# Patient Record
Sex: Male | Born: 1990 | Race: Black or African American | Hispanic: No | Marital: Single | Smoking: Never smoker
Health system: Southern US, Community
[De-identification: ages and names within clinical notes are randomized; demographics above are authoritative.]

## PROBLEM LIST (undated history)

## (undated) DIAGNOSIS — T7840XA Allergy, unspecified, initial encounter: Secondary | ICD-10-CM

## (undated) HISTORY — PX: ADENOIDECTOMY: SUR15

## (undated) HISTORY — DX: Allergy, unspecified, initial encounter: T78.40XA

---
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
## 2010-05-07 ENCOUNTER — Emergency Department (HOSPITAL_COMMUNITY): Admission: EM | Admit: 2010-05-07 | Discharge: 2010-05-07 | Payer: Self-pay | Admitting: Emergency Medicine

## 2010-05-12 ENCOUNTER — Emergency Department (HOSPITAL_COMMUNITY): Admission: EM | Admit: 2010-05-12 | Discharge: 2010-05-12 | Payer: Self-pay | Admitting: Emergency Medicine

## 2010-10-17 ENCOUNTER — Emergency Department (HOSPITAL_COMMUNITY): Admission: EM | Admit: 2010-10-17 | Discharge: 2010-10-18 | Payer: Self-pay | Admitting: Emergency Medicine

## 2011-12-21 ENCOUNTER — Encounter (HOSPITAL_COMMUNITY): Payer: Self-pay | Admitting: *Deleted

## 2011-12-21 ENCOUNTER — Emergency Department (HOSPITAL_COMMUNITY): Payer: Self-pay

## 2011-12-21 ENCOUNTER — Emergency Department (HOSPITAL_COMMUNITY)
Admission: EM | Admit: 2011-12-21 | Discharge: 2011-12-21 | Disposition: A | Discharge status: Home / Self Care | Payer: Self-pay | Attending: Emergency Medicine | Admitting: Emergency Medicine

## 2011-12-21 DIAGNOSIS — R5381 Other malaise: Secondary | ICD-10-CM | POA: Insufficient documentation

## 2011-12-21 DIAGNOSIS — IMO0001 Reserved for inherently not codable concepts without codable children: Secondary | ICD-10-CM | POA: Insufficient documentation

## 2011-12-21 DIAGNOSIS — R05 Cough: Secondary | ICD-10-CM | POA: Insufficient documentation

## 2011-12-21 DIAGNOSIS — B9789 Other viral agents as the cause of diseases classified elsewhere: Secondary | ICD-10-CM | POA: Insufficient documentation

## 2011-12-21 DIAGNOSIS — R5383 Other fatigue: Secondary | ICD-10-CM | POA: Insufficient documentation

## 2011-12-21 DIAGNOSIS — B349 Viral infection, unspecified: Secondary | ICD-10-CM

## 2011-12-21 DIAGNOSIS — R059 Cough, unspecified: Secondary | ICD-10-CM | POA: Insufficient documentation

## 2011-12-21 DIAGNOSIS — R062 Wheezing: Secondary | ICD-10-CM | POA: Insufficient documentation

## 2011-12-21 LAB — POCT I-STAT, CHEM 8
Calcium, Ion: 1.24 mmol/L (ref 1.12–1.32)
Creatinine, Ser: 1.4 mg/dL — ABNORMAL HIGH (ref 0.50–1.35)
Glucose, Bld: 89 mg/dL (ref 70–99)
Hemoglobin: 16.7 g/dL (ref 13.0–17.0)
TCO2: 26 mmol/L (ref 0–100)

## 2011-12-21 MED ORDER — IPRATROPIUM BROMIDE 0.02 % IN SOLN
0.5000 mg | Freq: Once | RESPIRATORY_TRACT | Status: AC
  Administered 2011-12-21: 0.5 mg via RESPIRATORY_TRACT
  Filled 2011-12-21: qty 2.5

## 2011-12-21 MED ORDER — ALBUTEROL SULFATE (5 MG/ML) 0.5% IN NEBU
5.0000 mg | INHALATION_SOLUTION | Freq: Once | RESPIRATORY_TRACT | Status: AC
  Administered 2011-12-21: 5 mg via RESPIRATORY_TRACT
  Filled 2011-12-21: qty 1

## 2011-12-21 MED ORDER — OSELTAMIVIR PHOSPHATE 75 MG PO CAPS: 75.0000 mg | ORAL_CAPSULE | Freq: Two times a day (BID) | ORAL | Status: AC

## 2011-12-21 NOTE — ED Provider Notes (Signed)
History     CSN: [id number]  Arrival date & time 12/21/11  0202   First MD Initiated Contact with Patient 12/21/11 0351      Chief Complaint  Patient presents with  . Influenza    HPI: Patient is a 21 y.o. male presenting with flu symptoms. The history is provided by the patient.  Influenza This is a new problem. The current episode started yesterday. The problem occurs constantly. The problem has been gradually worsening. Associated symptoms include chills, congestion, coughing, diaphoresis, fatigue, a fever and myalgias. Pertinent negatives include no abdominal pain, nausea, rash, sore throat or vomiting. The symptoms are aggravated by nothing. He has tried NSAIDs for the symptoms. The treatment provided mild relief.  Patient reports onset of flu like symptoms yesterday morning. States it began as itchy throat. He has since had diarrhea x 2, intermittent cough and temperature time as high as 102. Patient also has a history of asthma and states that he has had to use his inhalers and nebs more frequently than usual.  Past Medical History  Diagnosis Date  . Asthma     Past Surgical History  Procedure Date  . Adenoidectomy     History reviewed. No pertinent family history.  History  Substance Use Topics  . Smoking status: Not on file  . Smokeless tobacco: Not on file  . Alcohol Use:       Review of Systems  Constitutional: Positive for fever, chills, diaphoresis and fatigue.  HENT: Positive for congestion. Negative for sore throat.   Eyes: Negative.   Respiratory: Positive for cough.   Cardiovascular: Negative.   Gastrointestinal: Negative.  Negative for nausea, vomiting and abdominal pain.  Genitourinary: Negative.   Musculoskeletal: Positive for myalgias.  Skin: Negative.  Negative for rash.  Neurological: Negative.   Hematological: Negative.   Psychiatric/Behavioral: Negative.     Allergies  Review of patient's allergies indicates no known allergies.  Home  Medications   Current Outpatient Rx  Name Route Sig Dispense Refill  . ALBUTEROL SULFATE (2.5 MG/3ML) 0.083% IN NEBU Nebulization Take 2.5 mg by nebulization every 6 (six) hours as needed.    Marland Kitchen FLUTICASONE-SALMETEROL 250-50 MCG/DOSE IN AEPB Inhalation Inhale 1 puff into the lungs every 12 (twelve) hours.      BP 131/71  Pulse 90  Temp(Src) 99.1 F (37.3 C) (Oral)  Resp 16  Ht 6\' 3"  (1.905 m)  Wt 210 lb (95.255 kg)  BMI 26.25 kg/m2  SpO2 99%  Physical Exam  Constitutional: He appears well-developed and well-nourished.  HENT:  Head: Normocephalic and atraumatic.  Eyes: Conjunctivae are normal.  Neck: Neck supple.  Cardiovascular: Normal rate and regular rhythm.   Pulmonary/Chest: Effort normal. Not tachypneic. No respiratory distress.       Mild exp wheezes bil   Abdominal: Soft. Bowel sounds are normal.  Musculoskeletal: Normal range of motion.  Neurological: He is alert.  Skin: Skin is warm and dry.  Psychiatric: He has a normal mood and affect.    ED Course  Procedures Bilateralexp  wheezing has resolved after albuterol/Atrovent neb.Findings and clinical impression discussed with patient. We will plan for discharge home with prescription for Tamiflu and encourage patient to start as soon as possible. Will provide "[facility]"number to assist this patient in getting established with a primary care physician in this area as he is a Consulting civil engineer at [employer]. Will encourage follow up with primary care physician in his hometown when he returns home. Patient to return for worsening symptoms. Patient  agreeable with plan   Labs Reviewed  RAPID STREP SCREEN   No results found.   No diagnosis found.    MDM  HPI/PE and clinical findings consistent with viral syndrome. Chest x-ray negative for pneumonia Slight elevation in serum creatinine likely due to mild dehydration. Will treat with Tamiflu        Leanne Chang, NP 12/21/11 2214

## 2011-12-21 NOTE — ED Notes (Signed)
Pt awoke yesterday with an itchy throat which progressed to throat soreness, cough, diarrhea, and fever.  Pt denies vomiting.

## 2011-12-22 NOTE — ED Provider Notes (Signed)
Medical screening examination/treatment/procedure(s) were performed by non-physician practitioner and as supervising physician I was immediately available for consultation/collaboration.  Jasmine Awe, MD 12/22/11 [phone]

## 2012-05-12 ENCOUNTER — Ambulatory Visit (INDEPENDENT_AMBULATORY_CARE_PROVIDER_SITE_OTHER): Payer: BC Managed Care – PPO | Admitting: Physician Assistant

## 2012-05-12 VITALS — BP 114/68 | HR 71 | Temp 98.7°F | Resp 18 | Ht 73.5 in | Wt 204.0 lb

## 2012-05-12 DIAGNOSIS — J309 Allergic rhinitis, unspecified: Secondary | ICD-10-CM

## 2012-05-12 DIAGNOSIS — J029 Acute pharyngitis, unspecified: Secondary | ICD-10-CM

## 2012-05-12 LAB — POCT RAPID STREP A (OFFICE): Rapid Strep A Screen: NEGATIVE

## 2012-05-12 MED ORDER — MONTELUKAST SODIUM 10 MG PO TABS: 10.0000 mg | ORAL_TABLET | Freq: Every day | ORAL | Status: DC

## 2012-05-12 MED ORDER — IPRATROPIUM BROMIDE 0.03 % NA SOLN: 2.0000 | Freq: Two times a day (BID) | NASAL | Status: DC

## 2012-05-12 MED ORDER — AMOXICILLIN 875 MG PO TABS: 875.0000 mg | ORAL_TABLET | Freq: Two times a day (BID) | ORAL | Status: AC

## 2012-05-12 MED ORDER — PREDNISONE 20 MG PO TABS: ORAL_TABLET | ORAL | Status: AC

## 2012-05-12 NOTE — Progress Notes (Signed)
  Subjective:    Patient ID: Jacob Harvey, male    DOB: 04-Apr-1991, 21 y.o.   MRN: [id number]  HPI Patient presents with 2 day history of sore throat that was worse this a.m. He also complains of allergies that have not responded to OTC treatment. He states he has used Zyrtec, Claritin, and Allegra all of which have failed.  He has used nasal spray's in the past which did not seem to help either. He has a history of asthma treated with Advair and albuterol prn.  He says it is well controlled except flares occasionally with allergies.  No cough, fever, chills, nausea, vomiting, otalgia, or sinus pain.     Review of Systems  All other systems reviewed and are negative.       Objective:   Physical Exam  Constitutional: He is oriented to person, place, and time. He appears well-developed and well-nourished.  HENT:  Head: Normocephalic and atraumatic.  Right Ear: External ear normal.  Left Ear: External ear normal.  Mouth/Throat: Uvula is midline. Oropharyngeal exudate (on right tonsil) present.       1+ bilateral swelling  Eyes: Conjunctivae are normal.  Neck: Neck supple.  Cardiovascular: Normal rate, regular rhythm and normal heart sounds.   Pulmonary/Chest: He has wheezes (diffuse expiratory wheezes).  Lymphadenopathy:    He has cervical adenopathy (AC LAD).  Neurological: He is alert and oriented to person, place, and time.  Psychiatric: He has a normal mood and affect. His behavior is normal. Judgment and thought content normal.    Patient declined breathing treatment today.       Assessment & Plan:   1. Acute pharyngitis  Amoxicillin 875 mg bid x 10 days.  Recommend OTC tylenol or ibuprofen prn pain.  POCT rapid strep A  2. Allergic rhinitis  Will start singulair today due to failure of zyrtec, allegra, and claritin. Patient also has tried flonase/nasonex. Will try Atrovent NS today to see if it helps with rhinitis.

## 2012-05-12 NOTE — Patient Instructions (Signed)

## 2012-07-03 ENCOUNTER — Ambulatory Visit (INDEPENDENT_AMBULATORY_CARE_PROVIDER_SITE_OTHER): Payer: BC Managed Care – PPO | Admitting: Physician Assistant

## 2012-07-03 VITALS — BP 124/68 | HR 78 | Temp 98.9°F | Resp 16 | Ht 73.5 in | Wt 194.0 lb

## 2012-07-03 DIAGNOSIS — Z202 Contact with and (suspected) exposure to infections with a predominantly sexual mode of transmission: Secondary | ICD-10-CM

## 2012-07-03 DIAGNOSIS — J45909 Unspecified asthma, uncomplicated: Secondary | ICD-10-CM

## 2012-07-03 DIAGNOSIS — Z113 Encounter for screening for infections with a predominantly sexual mode of transmission: Secondary | ICD-10-CM

## 2012-07-03 LAB — POCT UA - MICROSCOPIC ONLY
Crystals, Ur, HPF, POC: NEGATIVE
Epithelial cells, urine per micros: NEGATIVE
Mucus, UA: POSITIVE
Yeast, UA: NEGATIVE

## 2012-07-03 LAB — POCT URINALYSIS DIPSTICK
Blood, UA: NEGATIVE
Leukocytes, UA: NEGATIVE
Nitrite, UA: NEGATIVE
Protein, UA: NEGATIVE
pH, UA: 6

## 2012-07-03 MED ORDER — AZITHROMYCIN 250 MG PO TABS: 1000.0000 mg | ORAL_TABLET | Freq: Once | ORAL | Status: DC

## 2012-07-03 MED ORDER — ALBUTEROL SULFATE HFA 108 (90 BASE) MCG/ACT IN AERS: 2.0000 | INHALATION_SPRAY | RESPIRATORY_TRACT | Status: DC | PRN

## 2012-07-03 NOTE — Progress Notes (Signed)
Subjective:    Patient ID: Jacob Harvey, male    DOB: 02/02/1991, 21 y.o.   MRN: [id number]  HPI This 21 y.o. Male presents for STI exposure.  Current sexual partner just called to notify him that she has chlamydia.  He has no symptoms.  Last week, his partner told him she had a UTI.  Inconsistent condom use.      Review of Systems As above.   Past Medical History  Diagnosis Date  . Asthma   . Allergy     Past Surgical History  Procedure Date  . Adenoidectomy     Prior to Admission medications   Medication Sig Start Date End Date Taking? Authorizing Provider  albuterol (PROVENTIL HFA;VENTOLIN HFA) 108 (90 BASE) MCG/ACT inhaler Inhale 2 puffs into the lungs every 4 (four) hours as needed. 07/03/12  Yes Peighton Mehra S Keelen Quevedo, PA-C  Fluticasone-Salmeterol (ADVAIR) 250-50 MCG/DOSE AEPB Inhale 1 puff into the lungs every 12 (twelve) hours.   Yes Historical Provider, MD  montelukast (SINGULAIR) 10 MG tablet Take 1 tablet (10 mg total) by mouth at bedtime. 05/12/12 05/12/13 Yes Heather M Marte, PA-C  azithromycin (ZITHROMAX) 250 MG tablet Take 4 tablets (1,000 mg total) by mouth once. 07/03/12   Marquett Bertoli S Hassan Blackshire, PA-C  ipratropium (ATROVENT) 0.03 % nasal spray Place 2 sprays into the nose 2 (two) times daily. 05/12/12 05/12/13  Nelva Nay, PA-C    No Known Allergies  History   Social History  . Marital Status: Single    Spouse Name: n/a    Number of Children: 0  . Years of Education: 16   Occupational History  . forklift operator     [employer]  . 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 Smoking status: Passive Smoker    Types: Cigars  . Smokeless tobacco: Never Used  . Alcohol Use: 4.8 oz/week    6 Cans of beer, 2 Shots of liquor per week  . Drug Use: No  . Sexually Active: Yes -- Male partner(s)   History reviewed. No pertinent family history.     Objective:   Physical Exam  Blood pressure  124/68, pulse 78, temperature 98.9 F (37.2 C), temperature source Oral, resp. rate 16, height 6' 1.5" (1.867 m), weight 194 lb (87.998 kg), SpO2 100.00%. Body mass index is 25.25 kg/(m^2). Well-developed, well nourished BM who is awake, alert and oriented, in NAD. HEENT: /AT, sclera and conjunctiva are clear.   Neck: supple, non-tender, no lymphadenopathy, thyromegaly. Heart: RRR, no murmur Lungs: Normal effort.  Occasional wheeze.  Results for orders placed in visit on 07/03/12  POCT UA - MICROSCOPIC ONLY      Component Value Range   WBC, Ur, HPF, POC 2-4     RBC, urine, microscopic 1-2     Bacteria, U Microscopic TRACE     Mucus, UA POS     Epithelial cells, urine per micros NEG     Crystals, Ur, HPF, POC NEG     Casts, Ur, LPF, POC NEG     Yeast, UA NEG    POCT URINALYSIS DIPSTICK      Component Value Range   Color, UA YELLOW     Clarity, UA CLEAR     Glucose, UA NEG     Bilirubin, UA NEG     Ketones, UA NEG     Spec Grav, UA >=1.030     Blood, UA NEG  pH, UA 6.0     Protein, UA NEG     Urobilinogen, UA 0.2     Nitrite, UA NEG     Leukocytes, UA Negative          Assessment & Plan:   1. Exposure to chlamydia  POCT UA - Microscopic Only, POCT urinalysis dipstick, GC/chlamydia probe amp, urine, HIV antibody, Hepatitis B surface antibody, Hepatitis B surface antigen, Hepatitis C antibody, HSV(herpes simplex vrs) 1+2 ab-IgG, RPR, azithromycin (ZITHROMAX) 250 MG tablet  2. Screening examination for venereal disease  HIV antibody, Hepatitis B surface antibody, Hepatitis B surface antigen, Hepatitis C antibody, HSV(herpes simplex vrs) 1+2 ab-IgG, RPR  3. Asthma  albuterol (PROVENTIL HFA;VENTOLIN HFA) 108 (90 BASE) MCG/ACT inhaler   Patient Instructions  Use condoms consistently. I will contact you with your lab results as soon as they are available.  If you have not heard from me in 2 weeks, please contact me.

## 2012-07-03 NOTE — Patient Instructions (Signed)
Use condoms consistently. I will contact you with your lab results as soon as they are available.  If you have not heard from me in 2 weeks, please contact me.

## 2012-07-04 LAB — HEPATITIS B SURFACE ANTIBODY, QUANTITATIVE: Hepatitis B-Post: 1000 m[IU]/mL

## 2012-07-04 LAB — HEPATITIS C ANTIBODY: HCV Ab: NEGATIVE

## 2012-07-06 LAB — HSV(HERPES SIMPLEX VRS) I + II AB-IGG
HSV 1 Glycoprotein G Ab, IgG: 11.26 IV — ABNORMAL HIGH
HSV 2 Glycoprotein G Ab, IgG: <0.1 IV

## 2012-07-07 ENCOUNTER — Encounter: Payer: Self-pay | Admitting: Physician Assistant

## 2012-07-07 ENCOUNTER — Telehealth: Payer: Self-pay

## 2012-07-07 NOTE — Telephone Encounter (Signed)
PT HAD LAB WORK DONE AND WOULD LIKE TO KNOW RESULTS PLEASE CALL [phone]

## 2012-07-07 NOTE — Telephone Encounter (Signed)
Called patient to advise his letter was sent to him and told him the results

## 2012-10-14 ENCOUNTER — Ambulatory Visit (INDEPENDENT_AMBULATORY_CARE_PROVIDER_SITE_OTHER): Payer: BC Managed Care – PPO | Admitting: Emergency Medicine

## 2012-10-14 VITALS — BP 125/74 | HR 48 | Temp 98.7°F | Resp 16 | Ht 73.5 in | Wt 202.0 lb

## 2012-10-14 DIAGNOSIS — J018 Other acute sinusitis: Secondary | ICD-10-CM

## 2012-10-14 DIAGNOSIS — J029 Acute pharyngitis, unspecified: Secondary | ICD-10-CM

## 2012-10-14 DIAGNOSIS — B86 Scabies: Secondary | ICD-10-CM

## 2012-10-14 MED ORDER — PSEUDOEPHEDRINE-GUAIFENESIN ER 60-600 MG PO TB12: 1.0000 | ORAL_TABLET | Freq: Two times a day (BID) | ORAL | Status: AC

## 2012-10-14 MED ORDER — PERMETHRIN 5 % EX CREA: TOPICAL_CREAM | Freq: Once | CUTANEOUS | Status: DC

## 2012-10-14 MED ORDER — AMOXICILLIN-POT CLAVULANATE 875-125 MG PO TABS: 1.0000 | ORAL_TABLET | Freq: Two times a day (BID) | ORAL | Status: DC

## 2012-10-14 NOTE — Progress Notes (Signed)
[facility] [address] [place] [address] [phone]  Date:  10/14/2012   Name:  Jacob Harvey   DOB:  January 06, 1991   MRN:  [id number]  PCP:  No primary provider on file.    Chief Complaint: Scabies and URI   History of Present Illness:  Jacob Harvey is a 21 y.o. very pleasant male patient who presents with the following:  Developed a pruritic eruption on hands and trunk now spread to thighs.  Contact told him that she has been to a dermatologist and diagnosed with scabies.    Has nasal congestion and post nasal drainage, sore throat.  Denies fever or chills.  No nausea or vomiting.  No stool change.  Has cough productive purulent sputum.  No wheezing or shortness of breath.  No improvement with OTC medications.  There is no problem list on file for this patient.   Past Medical History  Diagnosis Date  . Asthma   . Allergy     Past Surgical History  Procedure Date  . Adenoidectomy     History  Substance Use Topics  . Smoking status: Never Smoker   . Smokeless tobacco: Never Used  . Alcohol Use: 4.8 oz/week    6 Cans of beer, 2 Shots of liquor per week    No family history on file.  No Known Allergies  Medication list has been reviewed and updated.  Current Outpatient Prescriptions on File Prior to Visit  Medication Sig Dispense Refill  . albuterol (PROVENTIL HFA;VENTOLIN HFA) 108 (90 BASE) MCG/ACT inhaler Inhale 2 puffs into the lungs every 4 (four) hours as needed.  1 Inhaler  0  . Fluticasone-Salmeterol (ADVAIR) 250-50 MCG/DOSE AEPB Inhale 1 puff into the lungs every 12 (twelve) hours.      Marland Kitchen ipratropium (ATROVENT) 0.03 % nasal spray Place 2 sprays into the nose 2 (two) times daily.  30 mL  5    Review of Systems:  As per HPI, otherwise negative.    Physical Examination: Filed Vitals:   10/14/12 1842  BP: 125/74  Pulse: 48  Temp: 98.7 F (37.1 C)  Resp: 16   Filed Vitals:   10/14/12 1842  Height: 6' 1.5" (1.867  m)  Weight: 202 lb (91.627 kg)   Body mass index is 26.29 kg/(m^2). Ideal Body Weight: Weight in (lb) to have BMI = 25: 191.7    GEN: WDWN, NAD, Non-toxic, Alert & Oriented x 3  No sepsis or shortness of breath HEENT: Atraumatic, Normocephalic. Oropharynx significant for exudative pharyngitis Ears and Nose: No external deformity. TM negative EXTR: No clubbing/cyanosis/edema NEURO: Normal gait.  PSYCH: Normally interactive. Conversant. Not depressed or anxious appearing.  Calm demeanor.  SKIN:  Eruption characteristic of scabies   Assessment and Plan: Scabies sinusitis permethrin Follow up as needed  Jacob Dane, MD

## 2012-10-15 NOTE — Progress Notes (Signed)
Reviewed and agree.

## 2013-07-03 ENCOUNTER — Ambulatory Visit (INDEPENDENT_AMBULATORY_CARE_PROVIDER_SITE_OTHER): Payer: BC Managed Care – PPO | Admitting: Physician Assistant

## 2013-07-03 VITALS — BP 128/73 | HR 94 | Temp 102.7°F | Resp 18 | Ht 73.0 in | Wt 217.0 lb

## 2013-07-03 DIAGNOSIS — J029 Acute pharyngitis, unspecified: Secondary | ICD-10-CM

## 2013-07-03 DIAGNOSIS — J020 Streptococcal pharyngitis: Secondary | ICD-10-CM

## 2013-07-03 DIAGNOSIS — R509 Fever, unspecified: Secondary | ICD-10-CM

## 2013-07-03 LAB — POCT RAPID STREP A (OFFICE): Rapid Strep A Screen: POSITIVE — AB

## 2013-07-03 MED ORDER — METHYLPREDNISOLONE ACETATE 80 MG/ML IJ SUSP
80.0000 mg | Freq: Once | INTRAMUSCULAR | Status: AC
  Administered 2013-07-03: 80 mg via INTRAMUSCULAR

## 2013-07-03 MED ORDER — PENICILLIN G BENZATHINE 1200000 UNIT/2ML IM SUSP
1.2000 10*6.[IU] | Freq: Once | INTRAMUSCULAR | Status: AC
  Administered 2013-07-03: 1.2 10*6.[IU] via INTRAMUSCULAR

## 2013-07-03 MED ORDER — ACETAMINOPHEN 325 MG PO TABS
1000.0000 mg | ORAL_TABLET | Freq: Four times a day (QID) | ORAL | Status: DC | PRN
  Administered 2013-07-03: 975 mg via ORAL

## 2013-07-03 NOTE — Patient Instructions (Addendum)
Take ibuprofen 600-800 mg three times daily alternating with tylenol as needed for fever and pain.  Increase fluids and rest Change toothbrush Follow up if symptoms worsen or fail to improve.

## 2013-07-03 NOTE — Progress Notes (Signed)
  Subjective:    Patient ID: Jacob Harvey, male    DOB: Apr 08, 1991, 22 y.o.   MRN: [id number]  HPI 22 year old male presents with 3 day history of sore throat, fever, chills, and body aches.  States symptoms started suddenly and have progressively worsened.  Has painful swallowing - he is able to swallow but has been having a hard time due to pain.  No SOB or trouble breathing.  Denies nasal congestion, cough, otalgia, nausea, vomiting, or abdominal pain. Does admit to decreased appetite.  Has hx of strep throat with last infection some time last year.  No known strep contacts.   Patient is otherwise healthy with no other concerns today.     Review of Systems  Constitutional: Positive for fever and chills.  HENT: Positive for sore throat. Negative for ear pain, congestion, rhinorrhea, sneezing and postnasal drip.   Respiratory: Negative for cough and shortness of breath.   Gastrointestinal: Negative for nausea, vomiting and abdominal pain.  Neurological: Negative for headaches.       Objective:   Physical Exam  Constitutional: He appears well-developed and well-nourished.  HENT:  Head: Normocephalic and atraumatic.  Right Ear: Hearing, tympanic membrane, external ear and ear canal normal.  Left Ear: Hearing, tympanic membrane, external ear and ear canal normal.  Mouth/Throat: Uvula is midline and mucous membranes are normal. Oropharyngeal exudate and posterior oropharyngeal erythema (3+ tonsillar swelling) present. No posterior oropharyngeal edema or tonsillar abscesses.  Eyes: Conjunctivae are normal.  Neck: Normal range of motion. Neck supple.  Cardiovascular: Normal rate, regular rhythm and normal heart sounds.   Pulmonary/Chest: Effort normal and breath sounds normal.  Lymphadenopathy:    He has cervical adenopathy (AC).  Psychiatric: He has a normal mood and affect. His behavior is normal. Judgment and thought content normal.    Results for orders placed in visit on 07/03/13   POCT RAPID STREP A (OFFICE)      Result Value Range   Rapid Strep A Screen Positive (*) Negative         Assessment & Plan:  Fever, unspecified - Plan: acetaminophen (TYLENOL) tablet 975 mg  Acute pharyngitis - Plan: POCT rapid strep A, methylPREDNISolone acetate (DEPO-MEDROL) injection 80 mg  Strep pharyngitis - Plan: penicillin g benzathine (BICILLIN LA) 1200000 UNIT/2ML injection 1.2 Million Units  Bicillin IM given today in office.  Depomedrol 80 mg today.  Continue ibuprofen or tylenol as needed for fever and pain Change toothbrush Increase fluids and rest Follow up if symptoms worsen or fail to improve.

## 2013-07-25 IMAGING — CR DG CHEST 2V
2 series · 2 of 2 positions shown · non-contrast
Comparison: Chest radiograph performed 10/18/2010

CLINICAL DATA: Cough and fever for 3 days; history of asthma.

CHEST - 2 VIEW

[w chest pa]
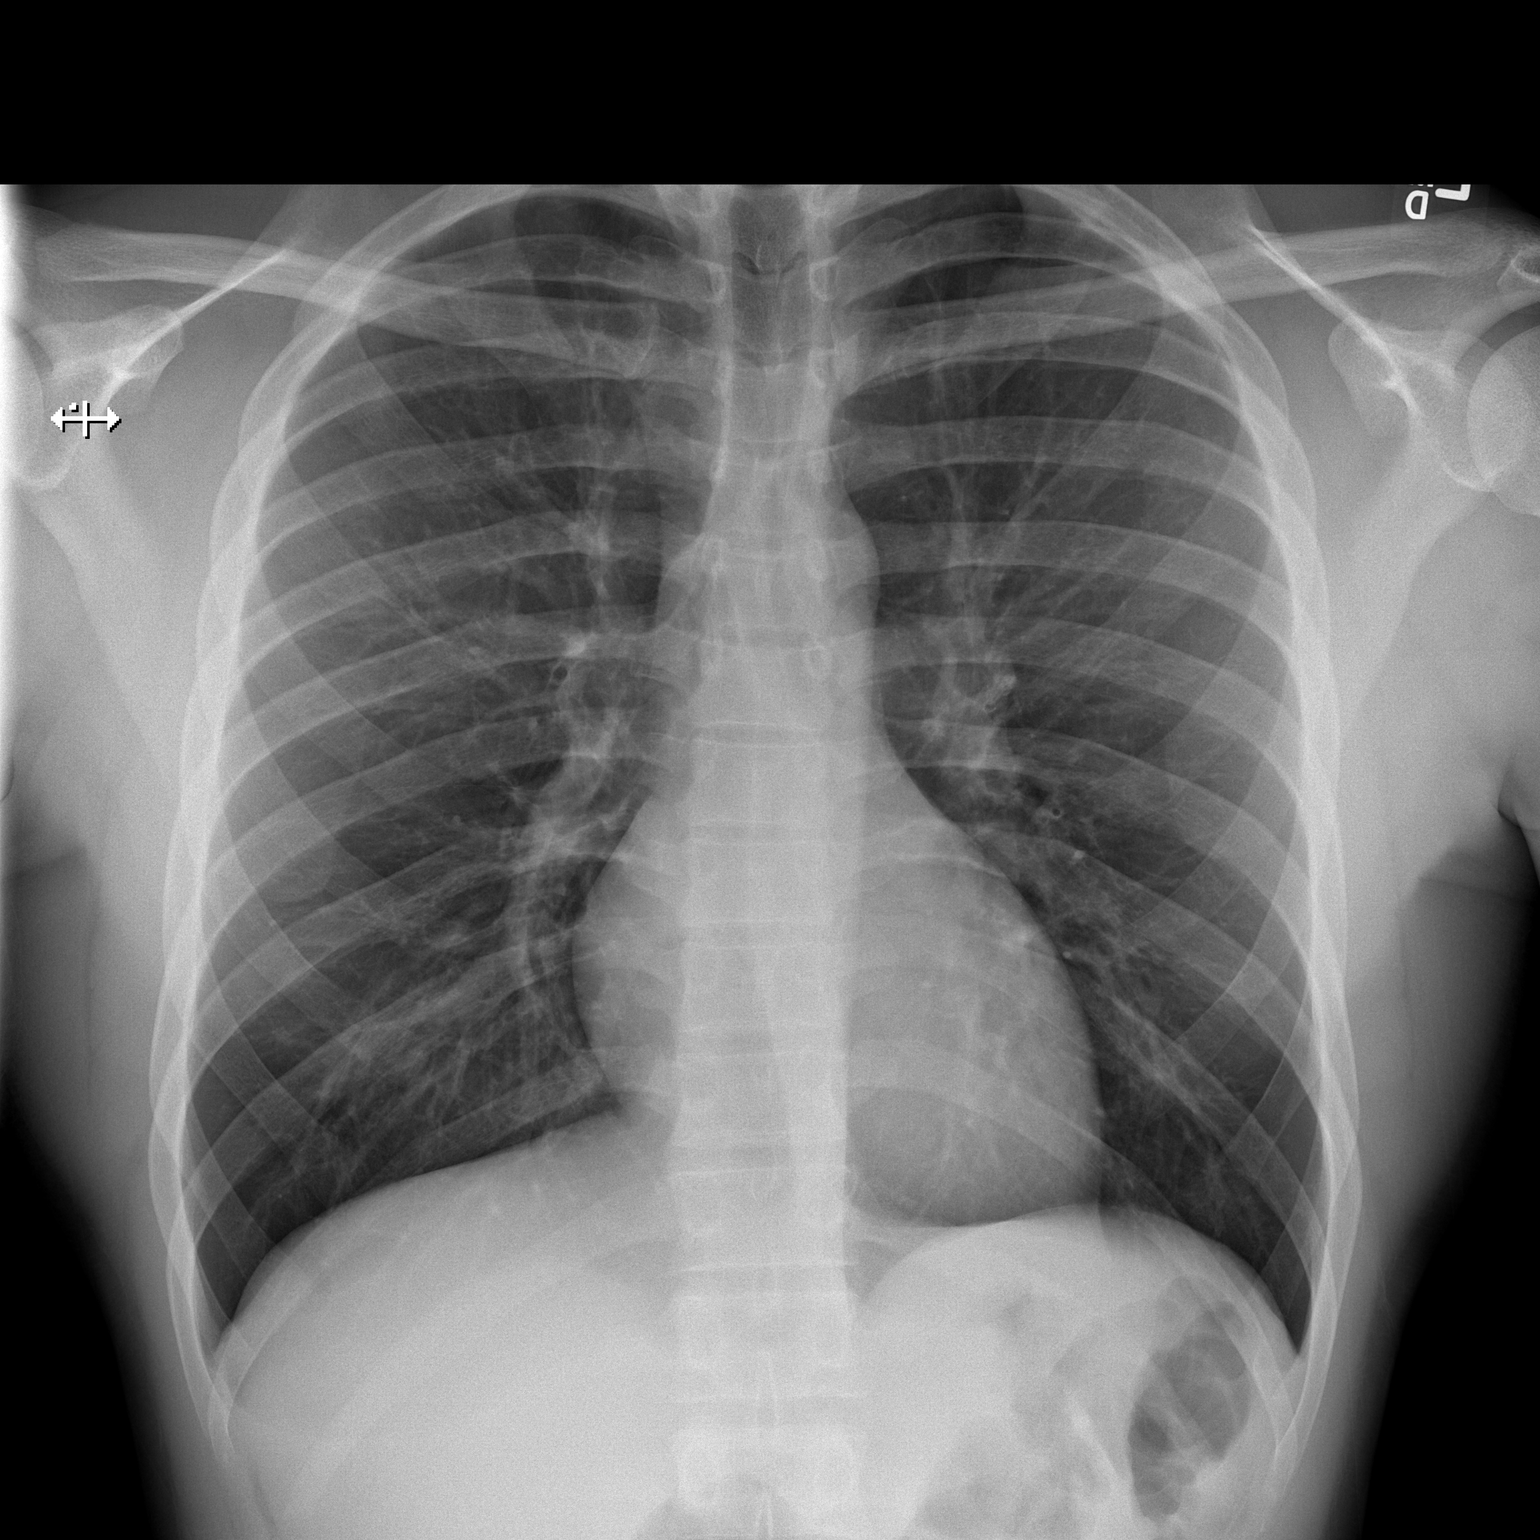

[w chest lat]
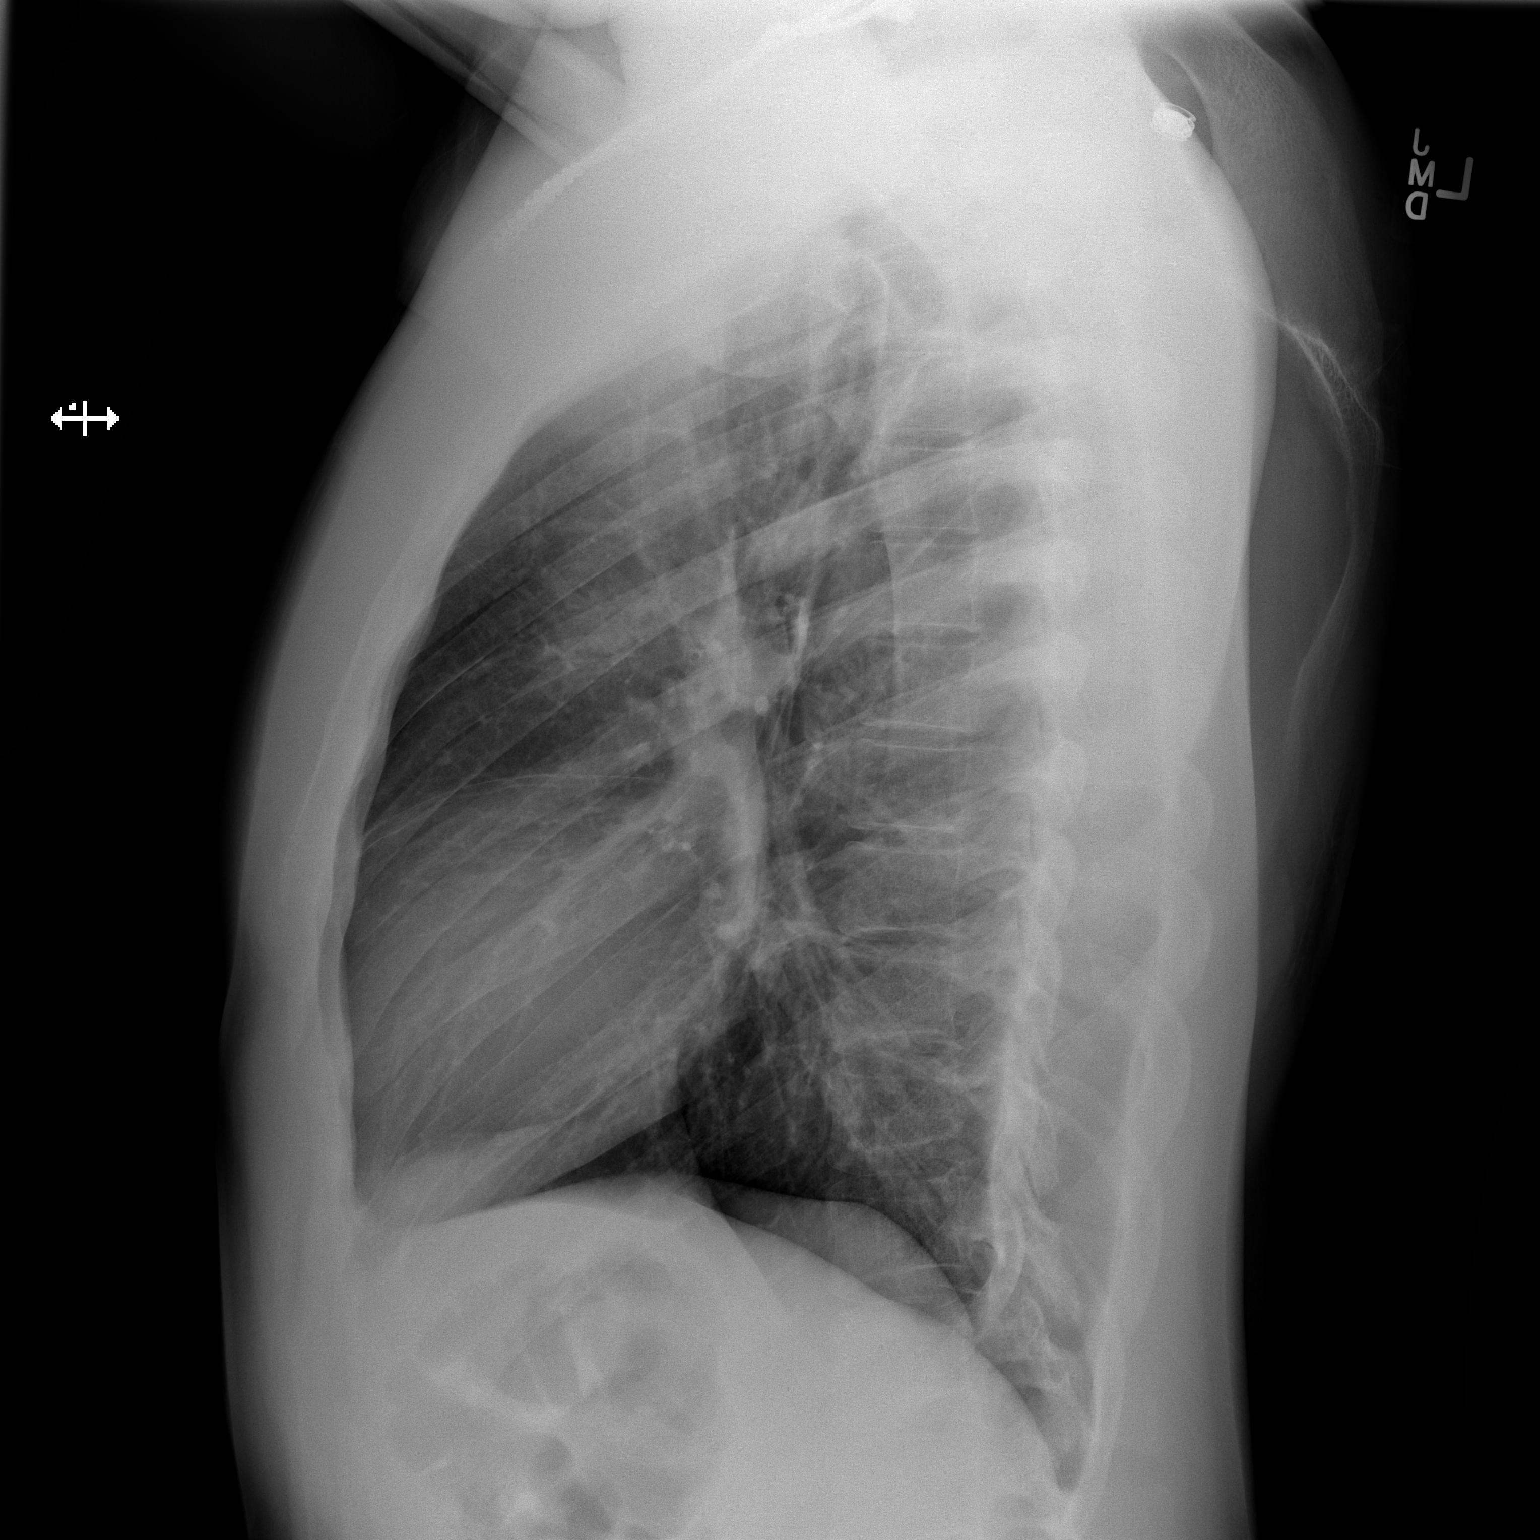

[2 of 2 positions shown; findings below may reference images not displayed]

FINDINGS: The lungs are well-aerated.  Mild peribronchial
thickening reflects the patient's history of asthma.  There is no
evidence of focal opacification, pleural effusion or pneumothorax.

The heart is normal in size; the mediastinal contour is within
normal limits.  No acute osseous abnormalities are seen.
IMPRESSION: No acute cardiopulmonary process seen.

## 2014-02-01 ENCOUNTER — Ambulatory Visit (INDEPENDENT_AMBULATORY_CARE_PROVIDER_SITE_OTHER): Payer: BC Managed Care – PPO | Admitting: Family Medicine

## 2014-02-01 VITALS — BP 108/62 | HR 59 | Temp 97.4°F | Resp 18 | Ht 73.5 in | Wt 221.6 lb

## 2014-02-01 DIAGNOSIS — J111 Influenza due to unidentified influenza virus with other respiratory manifestations: Secondary | ICD-10-CM

## 2014-02-01 DIAGNOSIS — J309 Allergic rhinitis, unspecified: Secondary | ICD-10-CM

## 2014-02-01 DIAGNOSIS — R6889 Other general symptoms and signs: Secondary | ICD-10-CM

## 2014-02-01 DIAGNOSIS — J45909 Unspecified asthma, uncomplicated: Secondary | ICD-10-CM

## 2014-02-01 DIAGNOSIS — J45901 Unspecified asthma with (acute) exacerbation: Secondary | ICD-10-CM

## 2014-02-01 LAB — POCT INFLUENZA A/B
Influenza A, POC: NEGATIVE
Influenza B, POC: NEGATIVE

## 2014-02-01 LAB — POCT CBC
Granulocyte percent: 62.7 %G (ref 37–80)
HEMATOCRIT: 46.5 % (ref 43.5–53.7)
Hemoglobin: 14.2 g/dL (ref 14.1–18.1)
LYMPH, POC: 2.7 (ref 0.6–3.4)
MCH, POC: 28.3 pg (ref 27–31.2)
MCHC: 30.5 g/dL — AB (ref 31.8–35.4)
MCV: 92.9 fL (ref 80–97)
MID (cbc): 1.1 — AB (ref 0–0.9)
MPV: 10.6 fL (ref 0–99.8)
POC GRANULOCYTE: 6.4 (ref 2–6.9)
POC LYMPH %: 26.1 % (ref 10–50)
POC MID %: 11.2 %M (ref 0–12)
Platelet Count, POC: 232 10*3/uL (ref 142–424)
RBC: 5.01 M/uL (ref 4.69–6.13)
RDW, POC: 14.2 %
WBC: 10.2 10*3/uL (ref 4.6–10.2)

## 2014-02-01 LAB — POCT RAPID STREP A (OFFICE): Rapid Strep A Screen: NEGATIVE

## 2014-02-01 MED ORDER — PREDNISONE 20 MG PO TABS: ORAL_TABLET | ORAL | Status: DC

## 2014-02-01 MED ORDER — IPRATROPIUM BROMIDE 0.03 % NA SOLN: 2.0000 | Freq: Two times a day (BID) | NASAL | Status: DC

## 2014-02-01 MED ORDER — ALBUTEROL SULFATE HFA 108 (90 BASE) MCG/ACT IN AERS: 2.0000 | INHALATION_SPRAY | Freq: Four times a day (QID) | RESPIRATORY_TRACT | Status: AC | PRN

## 2014-02-01 MED ORDER — OSELTAMIVIR PHOSPHATE 75 MG PO CAPS: 75.0000 mg | ORAL_CAPSULE | Freq: Two times a day (BID) | ORAL | Status: DC

## 2014-02-01 NOTE — Progress Notes (Addendum)
Subjective:  This chart was scribed for Nilda SimmerKristi Neilson Oehlert, MD by Carl Bestelina Holson, Medical Scribe. This patient was seen in Room 10 and the patient's care was started at 8:36 PM.   Patient ID: Jacob Harvey, male    DOB: [birth year], 23 y.o.   MRN: [id number]  HPI HPI Comments: Jacob Harvey is a 23 y.o. male with a history of Asthma who presents to the [facility] complaining of constant chest congestion and sore throat that started today.  The patient denies nausea, vomiting, diarrhea, headache, diaphoresis as associated symptoms.  The patient lists rhinorrhea, mild SOB, wheezing, cough, and myalgias as associated symptoms.  The patient states that he worked out today normally without losing his breath.  The patient states that he takes Advair regularly and denies taking the albuterol recently.  He denies being hospitalized for his Asthma.  The patient confirms sick contacts.  The patient states that he received his flu shot this year.  The patient denies having a history of pneumonia.  He states that he smokes occasionally.    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  He states [place]  He denies having any chronic health problems.  He states that his parents are healthy.  He states that he is single.  He denies having any allergies.  The patient has a history of adenoid surgery.     Past Medical History  Diagnosis Date  . Asthma   . Allergy    Past Surgical History  Procedure Laterality Date  . Adenoidectomy     History reviewed. No pertinent family history. History   Social History  . Marital Status: Single    Spouse Name: n/a    Number of Children: 0  . Years of Education: 16   Occupational History  . 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  Smoking status: Never Smoker   . Smokeless tobacco: Never Used  . Alcohol Use: 4.8 oz/week    6 Cans of beer, 2 Shots of liquor per week     Comment: occasional  . Drug Use: No  . Sexual Activity: Yes    Partners: Female   Other Topics Concern  . Not on file   Social History Narrative   Marital status: single      Employment: works at a club      Education:  [place] [employer]; business major; Insurance account managermanagement.   No Known Allergies  Review of Systems  Constitutional: Positive for fever. Negative for diaphoresis.  HENT: Positive for congestion, rhinorrhea and sore throat.   Respiratory: Positive for cough, shortness of breath and wheezing.   Gastrointestinal: Negative for nausea, vomiting and diarrhea.  Musculoskeletal: Positive for myalgias.  Neurological: Negative for headaches.  All other systems reviewed and are negative.     Objective:  Physical Exam  Nursing note and vitals reviewed. Constitutional: He is oriented to person, place, and time. He appears well-developed and well-nourished.  HENT:  Head: Normocephalic and atraumatic.  Right Ear: External ear normal.  Left Ear: External ear normal.  Nose: Nose normal.  Mouth/Throat: Posterior oropharyngeal erythema (diffuse) present. No oropharyngeal exudate.  Bilateral tonsillar hypertorphy.  Diffuse erythema.    Eyes: EOM are normal. Pupils are equal, round, and reactive to  light.  Neck: Normal range of motion.  Cardiovascular: Normal rate, regular rhythm and normal heart sounds.   No murmur heard. Pulmonary/Chest: Effort normal. No respiratory distress. He has wheezes. He has no rales.  Wheezing on force expiration throughout but good air movement.   Musculoskeletal: Normal range of motion.  Lymphadenopathy:    Cervical adenopathy: mild.  Neurological: He is alert and oriented to person, place, and time.  Skin: Skin is warm and dry.  Psychiatric: He has a normal mood and affect. His behavior is normal.    Results for  orders placed in visit on 02/01/14  POCT CBC      Result Value Ref Range   WBC 10.2  4.6 - 10.2 K/uL   Lymph, poc 2.7  0.6 - 3.4   POC LYMPH PERCENT 26.1  10 - 50 %L   MID (cbc) 1.1 (*) 0 - 0.9   POC MID % 11.2  0 - 12 %M   POC Granulocyte 6.4  2 - 6.9   Granulocyte percent 62.7  37 - 80 %G   RBC 5.01  4.69 - 6.13 M/uL   Hemoglobin 14.2  14.1 - 18.1 g/dL   HCT, POC 95.6  21.3 - 53.7 %   MCV 92.9  80 - 97 fL   MCH, POC 28.3  27 - 31.2 pg   MCHC 30.5 (*) 31.8 - 35.4 g/dL   RDW, POC 08.6     Platelet Count, POC 232  142 - 424 K/uL   MPV 10.6  0 - 99.8 fL  POCT INFLUENZA A/B      Result Value Ref Range   Influenza A, POC Negative     Influenza B, POC Negative    POCT RAPID STREP A (OFFICE)      Result Value Ref Range   Rapid Strep A Screen Negative  Negative   PEAK FLOW: 300 (PT REFUSED ALBUTEROL NEBULIZER IN OFFICE).  BP 108/62  Pulse 59  Temp(Src) 97.4 F (36.3 C) (Oral)  Resp 18  Ht 6' 1.5" (1.867 m)  Wt 221 lb 9.6 oz (100.517 kg)  BMI 28.84 kg/m2  SpO2 96%  PF 300 L/min Assessment & Plan:  Flu-like symptoms - Plan: POCT CBC, POCT Influenza A/B, POCT rapid strep A, Culture, Group A Strep  Asthma with acute exacerbation  Allergic rhinitis - Plan: ipratropium (ATROVENT) 0.03 % nasal spray  Asthma - Plan: albuterol (PROVENTIL HFA;VENTOLIN HFA) 108 (90 BASE) MCG/ACT inhaler  Influenza   1. INFLUENZA:  New.  Rx for Tamiflu due to comorbidity of asthma.  Restart Atrovent nasal spray.  2.  Asthma Exacerbation:  New.  Continue Advair. Rx for Prednisone provided; pt refused nebulizer treatment in office; advised to start Albuterol HFA qid scheduled for five days and then PRN. RTC for acute worsening SOB or wheezing. 3. Allergic Rhinitis: chronic/stable; refill of Atrovent provided.  I personally performed the services described in this documentation, which was scribed in my presence. The recorded information has been reviewed and is accurate.  Nilda Simmer,  M.D.  [facility] [address] [place], [place]  [address] [phone] phone [phone] fax

## 2014-02-01 NOTE — Patient Instructions (Signed)

## 2014-02-02 DIAGNOSIS — J309 Allergic rhinitis, unspecified: Secondary | ICD-10-CM | POA: Insufficient documentation

## 2014-02-02 DIAGNOSIS — J45901 Unspecified asthma with (acute) exacerbation: Secondary | ICD-10-CM | POA: Insufficient documentation

## 2014-02-04 LAB — CULTURE, GROUP A STREP: Organism ID, Bacteria: NORMAL

## 2014-03-19 ENCOUNTER — Ambulatory Visit (INDEPENDENT_AMBULATORY_CARE_PROVIDER_SITE_OTHER): Payer: BC Managed Care – PPO | Admitting: Family Medicine

## 2014-03-19 VITALS — BP 110/64 | HR 55 | Temp 97.9°F | Resp 14 | Ht 75.0 in | Wt 223.8 lb

## 2014-03-19 DIAGNOSIS — H1010 Acute atopic conjunctivitis, unspecified eye: Secondary | ICD-10-CM

## 2014-03-19 DIAGNOSIS — J45909 Unspecified asthma, uncomplicated: Secondary | ICD-10-CM

## 2014-03-19 DIAGNOSIS — J309 Allergic rhinitis, unspecified: Secondary | ICD-10-CM

## 2014-03-19 DIAGNOSIS — J029 Acute pharyngitis, unspecified: Secondary | ICD-10-CM

## 2014-03-19 DIAGNOSIS — H1045 Other chronic allergic conjunctivitis: Secondary | ICD-10-CM

## 2014-03-19 DIAGNOSIS — J302 Other seasonal allergic rhinitis: Secondary | ICD-10-CM

## 2014-03-19 MED ORDER — METHYLPREDNISOLONE ACETATE 80 MG/ML IJ SUSP
80.0000 mg | Freq: Once | INTRAMUSCULAR | Status: AC
  Administered 2014-03-19: 80 mg via INTRAMUSCULAR

## 2014-03-19 MED ORDER — FLUTICASONE PROPIONATE 50 MCG/ACT NA SUSP
2.0000 | Freq: Every day | NASAL | Status: DC
Start: 2014-03-19 — End: 2015-11-12

## 2014-03-19 MED ORDER — OLOPATADINE HCL 0.1 % OP SOLN: 1.0000 [drp] | Freq: Two times a day (BID) | OPHTHALMIC | Status: DC

## 2014-03-19 MED ORDER — MONTELUKAST SODIUM 10 MG PO TABS
10.0000 mg | ORAL_TABLET | Freq: Every day | ORAL | Status: DC
Start: 2014-03-19 — End: 2015-11-12

## 2014-03-19 MED ORDER — AZELASTINE HCL 0.1 % NA SOLN: NASAL | Status: DC

## 2014-03-19 NOTE — Progress Notes (Signed)
Subjective: 23 year old man with a history of year-round allergies. However the spring it has gotten very bad. He can breathe through his nose. He's had a hard time sleeping because of it. He has some sore throat. His dentist said his throat was swollen. He has a history of asthma, but that has not been real bad recently. He has tried numerous OTC antihistamines. He's not been feverish. His eyes have been running itching. His nose is very congested as noted.  Objective: TMs are normal. Eyes are mildly injected with the irritation. Nose very congested. Throat has large tonsils, pale. Small anterior cervical nodes. Chest is clear to auscultation except for a slight wheeze especially at the left base. Heart regular without murmurs  Assessment: Allergic rhinitis, conjunctivitis, pharyngitis, and asthma in a patient with seasonal and perennial allergy.  Plan: Treat with eyedrops, nasal sprays, oral antihistamines, and give a shot of Depo-Medrol 80.

## 2014-03-19 NOTE — Patient Instructions (Signed)
Takes Singulair one daily at bedtime  Use the Astelin nasal spray 1 or 2 sprays each nostril twice daily  Use the Flonase (fluticasone) nose spray 2 sprays each nostril once daily  Use the Patanol eyedrops 1 drop each eye twice daily  Try using over-the-counter Chlor-Trimeton. I believe it comes in generic also. This is an antihistamine, a little different from the Allegra, Zyrtec, Claritin.  Return if needed

## 2014-08-04 ENCOUNTER — Ambulatory Visit: Payer: BC Managed Care – PPO

## 2015-11-10 ENCOUNTER — Emergency Department (HOSPITAL_COMMUNITY): Payer: BLUE CROSS/BLUE SHIELD

## 2015-11-10 ENCOUNTER — Encounter (HOSPITAL_COMMUNITY): Payer: Self-pay | Admitting: Emergency Medicine

## 2015-11-10 ENCOUNTER — Inpatient Hospital Stay (HOSPITAL_COMMUNITY)
Admission: EM | Admit: 2015-11-10 | Discharge: 2015-11-12 | DRG: 202 | Disposition: A | Discharge status: Home / Self Care | Payer: BLUE CROSS/BLUE SHIELD | Attending: Internal Medicine | Admitting: Internal Medicine

## 2015-11-10 DIAGNOSIS — F1910 Other psychoactive substance abuse, uncomplicated: Secondary | ICD-10-CM | POA: Diagnosis present

## 2015-11-10 DIAGNOSIS — Z825 Family history of asthma and other chronic lower respiratory diseases: Secondary | ICD-10-CM

## 2015-11-10 DIAGNOSIS — Z79899 Other long term (current) drug therapy: Secondary | ICD-10-CM

## 2015-11-10 DIAGNOSIS — R9431 Abnormal electrocardiogram [ECG] [EKG]: Secondary | ICD-10-CM | POA: Diagnosis present

## 2015-11-10 DIAGNOSIS — T797XXA Traumatic subcutaneous emphysema, initial encounter: Secondary | ICD-10-CM | POA: Diagnosis present

## 2015-11-10 DIAGNOSIS — F1210 Cannabis abuse, uncomplicated: Secondary | ICD-10-CM | POA: Diagnosis present

## 2015-11-10 DIAGNOSIS — Z23 Encounter for immunization: Secondary | ICD-10-CM

## 2015-11-10 DIAGNOSIS — R0602 Shortness of breath: Secondary | ICD-10-CM | POA: Diagnosis present

## 2015-11-10 DIAGNOSIS — J45901 Unspecified asthma with (acute) exacerbation: Principal | ICD-10-CM | POA: Diagnosis present

## 2015-11-10 DIAGNOSIS — J982 Interstitial emphysema: Secondary | ICD-10-CM | POA: Diagnosis not present

## 2015-11-10 DIAGNOSIS — N289 Disorder of kidney and ureter, unspecified: Secondary | ICD-10-CM | POA: Diagnosis present

## 2015-11-10 DIAGNOSIS — N182 Chronic kidney disease, stage 2 (mild): Secondary | ICD-10-CM | POA: Diagnosis present

## 2015-11-10 DIAGNOSIS — X58XXXA Exposure to other specified factors, initial encounter: Secondary | ICD-10-CM | POA: Diagnosis present

## 2015-11-10 DIAGNOSIS — J029 Acute pharyngitis, unspecified: Secondary | ICD-10-CM | POA: Diagnosis present

## 2015-11-10 DIAGNOSIS — Z7952 Long term (current) use of systemic steroids: Secondary | ICD-10-CM | POA: Diagnosis not present

## 2015-11-10 DIAGNOSIS — D72829 Elevated white blood cell count, unspecified: Secondary | ICD-10-CM | POA: Diagnosis present

## 2015-11-10 DIAGNOSIS — J309 Allergic rhinitis, unspecified: Secondary | ICD-10-CM | POA: Diagnosis not present

## 2015-11-10 LAB — I-STAT CHEM 8, ED
BUN: 31 mg/dL — AB (ref 6–20)
CHLORIDE: 102 mmol/L (ref 101–111)
CREATININE: 1.2 mg/dL (ref 0.61–1.24)
Calcium, Ion: 1.06 mmol/L — ABNORMAL LOW (ref 1.12–1.23)
Glucose, Bld: 105 mg/dL — ABNORMAL HIGH (ref 65–99)
HEMATOCRIT: 49 % (ref 39.0–52.0)
Hemoglobin: 16.7 g/dL (ref 13.0–17.0)
Potassium: 3.9 mmol/L (ref 3.5–5.1)
Sodium: 140 mmol/L (ref 135–145)
TCO2: 26 mmol/L (ref 0–100)

## 2015-11-10 LAB — CBC WITH DIFFERENTIAL/PLATELET
BASOS ABS: 0 10*3/uL (ref 0.0–0.1)
BASOS PCT: 0 %
Eosinophils Absolute: 0 10*3/uL (ref 0.0–0.7)
Eosinophils Relative: 0 %
HEMATOCRIT: 43.4 % (ref 39.0–52.0)
HEMOGLOBIN: 14.5 g/dL (ref 13.0–17.0)
LYMPHS PCT: 17 %
Lymphs Abs: 3.1 10*3/uL (ref 0.7–4.0)
MCH: 28.9 pg (ref 26.0–34.0)
MCHC: 33.4 g/dL (ref 30.0–36.0)
MCV: 86.5 fL (ref 78.0–100.0)
MONOS PCT: 9 %
Monocytes Absolute: 1.7 10*3/uL — ABNORMAL HIGH (ref 0.1–1.0)
NEUTROS ABS: 13.6 10*3/uL — AB (ref 1.7–7.7)
Neutrophils Relative %: 74 %
Platelets: 237 10*3/uL (ref 150–400)
RBC: 5.02 MIL/uL (ref 4.22–5.81)
RDW: 14.6 % (ref 11.5–15.5)
WBC: 18.4 10*3/uL — ABNORMAL HIGH (ref 4.0–10.5)

## 2015-11-10 LAB — RAPID STREP SCREEN (MED CTR MEBANE ONLY): Streptococcus, Group A Screen (Direct): NEGATIVE

## 2015-11-10 MED ORDER — MOMETASONE FURO-FORMOTEROL FUM 100-5 MCG/ACT IN AERO
2.0000 | INHALATION_SPRAY | Freq: Two times a day (BID) | RESPIRATORY_TRACT | Status: DC
  Administered 2015-11-11 – 2015-11-12 (×3): 2 via RESPIRATORY_TRACT
  Filled 2015-11-10: qty 8.8

## 2015-11-10 MED ORDER — ALBUTEROL (5 MG/ML) CONTINUOUS INHALATION SOLN: 10.0000 mg/h | INHALATION_SOLUTION | Freq: Once | RESPIRATORY_TRACT | Status: DC

## 2015-11-10 MED ORDER — HYDROCOD POLST-CPM POLST ER 10-8 MG/5ML PO SUER: 5.0000 mL | Freq: Once | ORAL | Status: DC

## 2015-11-10 MED ORDER — ALBUTEROL SULFATE (2.5 MG/3ML) 0.083% IN NEBU
5.0000 mg | INHALATION_SOLUTION | Freq: Once | RESPIRATORY_TRACT | Status: AC
  Administered 2015-11-10: 5 mg via RESPIRATORY_TRACT
  Filled 2015-11-10: qty 6

## 2015-11-10 MED ORDER — HYDROCOD POLST-CPM POLST ER 10-8 MG/5ML PO SUER
5.0000 mL | Freq: Once | ORAL | Status: DC
  Filled 2015-11-10: qty 5

## 2015-11-10 MED ORDER — IOHEXOL 300 MG/ML  SOLN
80.0000 mL | Freq: Once | INTRAMUSCULAR | Status: AC | PRN
  Administered 2015-11-10: 80 mL via INTRAVENOUS

## 2015-11-10 MED ORDER — SODIUM CHLORIDE 0.9 % IJ SOLN
3.0000 mL | Freq: Two times a day (BID) | INTRAMUSCULAR | Status: DC
  Administered 2015-11-11 (×2): 3 mL via INTRAVENOUS

## 2015-11-10 MED ORDER — DEXTROSE 5 % IV SOLN
500.0000 mg | INTRAVENOUS | Status: DC
  Administered 2015-11-11 (×2): 500 mg via INTRAVENOUS
  Filled 2015-11-10 (×2): qty 500

## 2015-11-10 MED ORDER — IPRATROPIUM BROMIDE 0.02 % IN SOLN
0.5000 mg | Freq: Once | RESPIRATORY_TRACT | Status: AC
  Administered 2015-11-10: 0.5 mg via RESPIRATORY_TRACT
  Filled 2015-11-10: qty 2.5

## 2015-11-10 MED ORDER — IBUPROFEN 800 MG PO TABS
800.0000 mg | ORAL_TABLET | Freq: Once | ORAL | Status: AC
  Administered 2015-11-10: 800 mg via ORAL
  Filled 2015-11-10: qty 1

## 2015-11-10 MED ORDER — SODIUM CHLORIDE 0.9 % IV SOLN
INTRAVENOUS | Status: DC
Start: 2015-11-10 — End: 2015-11-12
  Administered 2015-11-11: 100 mL/h via INTRAVENOUS
  Administered 2015-11-11 (×2): via INTRAVENOUS

## 2015-11-10 MED ORDER — ALBUTEROL SULFATE (2.5 MG/3ML) 0.083% IN NEBU
5.0000 mg | INHALATION_SOLUTION | RESPIRATORY_TRACT | Status: DC | PRN
  Administered 2015-11-11: 5 mg via RESPIRATORY_TRACT
  Filled 2015-11-10: qty 6

## 2015-11-10 MED ORDER — MORPHINE SULFATE (PF) 4 MG/ML IV SOLN
4.0000 mg | Freq: Once | INTRAVENOUS | Status: AC
  Administered 2015-11-10: 4 mg via INTRAVENOUS
  Filled 2015-11-10: qty 1

## 2015-11-10 MED ORDER — ONDANSETRON HCL 4 MG/2ML IJ SOLN
4.0000 mg | Freq: Once | INTRAMUSCULAR | Status: AC
  Administered 2015-11-10: 4 mg via INTRAVENOUS
  Filled 2015-11-10: qty 2

## 2015-11-10 MED ORDER — MORPHINE SULFATE (PF) 2 MG/ML IV SOLN
2.0000 mg | INTRAVENOUS | Status: DC | PRN
  Administered 2015-11-11 – 2015-11-12 (×6): 2 mg via INTRAVENOUS
  Filled 2015-11-10 (×6): qty 1

## 2015-11-10 MED ORDER — ONDANSETRON HCL 4 MG/2ML IJ SOLN: 4.0000 mg | Freq: Three times a day (TID) | INTRAMUSCULAR | Status: DC | PRN

## 2015-11-10 MED ORDER — ALBUTEROL SULFATE (2.5 MG/3ML) 0.083% IN NEBU
INHALATION_SOLUTION | RESPIRATORY_TRACT | Status: AC
  Administered 2015-11-10: 19:00:00
  Filled 2015-11-10: qty 12

## 2015-11-10 MED ORDER — METHYLPREDNISOLONE SODIUM SUCC 125 MG IJ SOLR
60.0000 mg | Freq: Two times a day (BID) | INTRAMUSCULAR | Status: DC
  Administered 2015-11-11 – 2015-11-12 (×4): 60 mg via INTRAVENOUS
  Filled 2015-11-10 (×4): qty 2

## 2015-11-10 NOTE — ED Notes (Signed)
Pt reports asthma flare-up since Wednesday. Went to UC on Wednesday and was prescribed prednisone. Pt has been taking home advair and albuterol inhaler with minimal relief. Pt has tightness on R chest, and a congested cough.  Pt also began to have a sore throat today.

## 2015-11-10 NOTE — ED Notes (Signed)
Hospitalist at bedside 

## 2015-11-10 NOTE — ED Provider Notes (Signed)
CSN: [id number]     Arrival date & time 11/10/15  1755 History   First MD Initiated Contact with Patient 11/10/15 1824     Chief Complaint  Patient presents with  . Asthma  . Sore Throat     (Consider location/radiation/quality/duration/timing/severity/associated sxs/prior Treatment) The history is provided by the patient.     Pt with hx asthma presents with 5 days of cough, SOB, wheezing, now with sore throat. Was seen at urgent care two days ago and given prednisone dose pack, which he is taking along with his advair and albuterol hfa without improvement.  States he feels like someone has a fist in his right chest, extending up into his right upper chest.  Denies fevers, chills, night sweats, hemoptysis.  He has environmental allergies and states he always has more problems with his asthma in the fall.  States that he would normally feel better after 2-3 days of prednisone and it has made no difference.    Past Medical History  Diagnosis Date  . Asthma   . Allergy    Past Surgical History  Procedure Laterality Date  . Adenoidectomy     History reviewed. No pertinent family history. Social History  Substance Use Topics  . Smoking status: Never Smoker   . Smokeless tobacco: Never Used  . Alcohol Use: 4.8 oz/week    6 Cans of beer, 2 Shots of liquor per week     Comment: occasional    Review of Systems  Constitutional: Negative for fever, chills and diaphoresis.  HENT: Positive for sore throat. Negative for trouble swallowing.   Respiratory: Positive for cough, shortness of breath and wheezing.   Cardiovascular: Positive for chest pain.  Musculoskeletal: Negative for myalgias.  Skin: Negative for rash.  Allergic/Immunologic: Negative for immunocompromised state.  Hematological: Does not bruise/bleed easily.  Psychiatric/Behavioral: Negative for self-injury.      Allergies  Review of patient's allergies indicates no known allergies.  Home Medications   Prior to  Admission medications   Medication Sig Start Date End Date Taking? Authorizing Provider  albuterol (PROVENTIL HFA;VENTOLIN HFA) 108 (90 BASE) MCG/ACT inhaler Inhale 2 puffs into the lungs every 6 (six) hours as needed. 02/01/14   Ethelda Chick, MD  azelastine (ASTELIN) 137 MCG/SPRAY nasal spray Use 1 or 2 sprays in each nostril twice daily 03/19/14   Peyton Najjar, MD  fluticasone [facility]) 50 MCG/ACT nasal spray Place 2 sprays into both nostrils daily. 03/19/14   Peyton Najjar, MD  Fluticasone-Salmeterol (ADVAIR) 250-50 MCG/DOSE AEPB Inhale 1 puff into the lungs every 12 (twelve) hours.    Historical Provider, MD  ipratropium (ATROVENT) 0.03 % nasal spray Place 2 sprays into both nostrils 2 (two) times daily. 02/01/14 02/01/15  Ethelda Chick, MD  montelukast (SINGULAIR) 10 MG tablet Take 1 tablet (10 mg total) by mouth at bedtime. 03/19/14   Peyton Najjar, MD  olopatadine (PATANOL) 0.1 % ophthalmic solution Place 1 drop into both eyes 2 (two) times daily. 03/19/14   Peyton Najjar, MD  permethrin (ELIMITE) 5 % cream Apply topically once. 10/14/12   Carmelina Dane, MD   BP 115/81 mmHg  Pulse 88  Temp(Src) 97.5 F (36.4 C) (Oral)  Resp 20  SpO2 94% Physical Exam  Constitutional: He appears well-developed and well-nourished. No distress.  HENT:  Head: Normocephalic and atraumatic.  Mouth/Throat: Uvula is midline. Mucous membranes are not dry. Posterior oropharyngeal erythema present. No oropharyngeal exudate, posterior oropharyngeal edema or tonsillar abscesses.  Eyes: Conjunctivae are normal.  Neck: Neck supple.  Cardiovascular: Normal rate and regular rhythm.   Pulmonary/Chest: Effort normal. No accessory muscle usage. No respiratory distress. He has decreased breath sounds. He has wheezes. He has no rhonchi. He has no rales.  Diffuse loud wheezes in all fields.   Neurological: He is alert.  Skin: He is not diaphoretic.  Nursing note and vitals reviewed.   ED Course  Procedures  (including critical care time) Labs Review Labs Reviewed  CBC WITH DIFFERENTIAL/PLATELET - Abnormal; Notable for the following:    WBC 18.4 (*)    Neutro Abs 13.6 (*)    Monocytes Absolute 1.7 (*)    All other components within normal limits  I-STAT CHEM 8, ED - Abnormal; Notable for the following:    BUN 31 (*)    Glucose, Bld 105 (*)    Calcium, Ion 1.06 (*)    All other components within normal limits  RAPID STREP SCREEN (NOT AT [facility])  CULTURE, GROUP A STREP  CULTURE, EXPECTORATED SPUTUM-ASSESSMENT  PROTIME-INR  APTT  INFLUENZA PANEL BY PCR (TYPE A & B, H1N1)  URINE RAPID DRUG SCREEN, HOSP PERFORMED  HIV ANTIBODY (ROUTINE TESTING)  COMPREHENSIVE METABOLIC PANEL  CBC    Imaging Review Dg Chest 2 View  11/10/2015  CLINICAL DATA:  Cough.  Right chest pain.  Wheezing. EXAM: CHEST  2 VIEW COMPARISON:  12/21/2011 chest radiograph. FINDINGS: There is new extensive pneumomediastinum throughout the mediastinum. There is subcutaneous emphysema in the medial bilateral lower neck. Normal heart size. No pneumothorax. No pleural effusion. Clear lungs, with no focal lung consolidation and no pulmonary edema. IMPRESSION: New extensive pneumomediastinum and subcutaneous emphysema in the medial bilateral lower neck. Esophageal perforation cannot be excluded. Recommend further evaluation with chest CT with IV and water soluble oral contrast and/or esophagram. These results were called by telephone at the time of interpretation on 11/10/2015 at 9:08 pm to PA [facility] Anicka Stuckert , who verbally acknowledged these results. Electronically Signed   By: Delbert PhenixJason A Poff M.D.   On: 11/10/2015 21:06   Ct Chest W Contrast  11/10/2015  CLINICAL DATA:  Asthma flare up.  Pneumomediastinum on radiography. EXAM: CT CHEST WITH CONTRAST TECHNIQUE: Multidetector CT imaging of the chest was performed during intravenous contrast administration. CONTRAST:  80mL OMNIPAQUE IOHEXOL 300 MG/ML  SOLN COMPARISON:  11/10/2015 radiographs  FINDINGS: There is extensive pneumomediastinum. There is distension of the esophagus with contrast and air, with no contrast leakage from the esophagus. There is no pneumothorax. There is no effusion. The lungs are clear. Central airways are patent. No mediastinal or hilar adenopathy. No significant skeletal lesion. IMPRESSION: Pneumomediastinum.  Esophagus appears intact. Electronically Signed   By: Ellery Plunkaniel R Mitchell M.D.   On: 11/10/2015 22:29   I have personally reviewed and evaluated these images and lab results as part of my medical decision-making.   EKG Interpretation None       9:30 PM Discussed pt with Dr Cyndie ChimeNguyen   11:19 PM I spoke with Dr Clyde LundborgNiu who accepts admission, requests that I call cardiothoracic surgery for any additional recommendations.   11:39 PM I spoke with Dr Cornelius Moraswen, cardiothoracic surgery, for recommendations.  Pt suspected to have spontaneous primary pneumomediastinum from coughing.  Continue to treat asthma and keep pt from coughing.  He may be kept NPO overnight with further study with esophagram in the morning if concern for esophageal rupture but unlikely given clinical picture.  Will need follow up chest xray in the morning to make  sure pt has not developed pneumothorax.  MDM   Final diagnoses:  Pneumomediastinum (HCC)    Afebrile, nontoxic patient with 5 days of asthma exacerbation from viral vs allergic symptoms.  Not improving with inhalers and prednisone dose pack x 3 days.  CXR demonstrates pneumomediastinum.  Nebs given with improvement of wheezing but not of pain.  02 remained in the mid to low 90s.   CT demonstrated pneumonmediastinum with no other abnormality.  Discussed all results and updated patient.  Pt agrees with admission.  Admitted to [facility], Dr Clyde Lundborg.  I spoke with Dr Cornelius Moras, cardiothoracic surgery, for discussion and recommendations - please see above.          Trixie Dredge, PA-C 11/11/15 0100  Leta Baptist, MD 11/11/15 [phone]

## 2015-11-10 NOTE — H&P (Signed)
[facility] History and Physical  Jacob Harvey ZOX:[id number] DOB: 19-Jun-1991 DOA: 11/10/2015  Referring physician: ED physician PCP: No PCP Per Patient  Specialists:   Chief Complaint: Cough, shortness of breath, wheezing and chest pain  HPI: Jacob Harvey is a 24 y.o. male with PMH of asthma, allergy, who presents with cough, shortness of breath, wheezing in the chest pain.  Patient reports that he start having worsening shortness of breath and wheezing on 11/06/15. He has dry cough. No fever or chills. He was seen in urgent care on 11/30, and was given prescription of prednisone. He has been taking this medication and continues to use his home Advair inhaler, but no significant improvement. He also has chest pain over front chest and right neck base. The chest pain is pleuritic, is aggravated by coughing and deep breath. It is also aggravated by swallowing. No tenderness over calf areas. Patient is physically active. Patient does not have fever, chills, abdominal pain, diarrhea, symptoms of UTI or unilateral weakness.    In ED, patient was found to have WBC 18.4 (patient is on prednisone), temperature normal, no tachycardia, electrolytes and renal function okay, negative rapid strept test. Chest x-ray showed no infiltration, but showed extensive pneumomediastinum which was confirmed by CT chest.   Where does patient live?   At home    Can patient participate in ADLs?  Yes   Review of Systems:   General: no fevers, chills, no changes in body weight, has fatigue HEENT: no blurry vision, hearing changes or sore throat Pulm: has dyspnea, coughing, wheezing CV: has chest pain, no palpitations Abd: no nausea, vomiting, abdominal pain, diarrhea, constipation GU: no dysuria, burning on urination, increased urinary frequency, hematuria  Ext: no leg edema Neuro: no unilateral weakness, numbness, or tingling, no vision change or hearing loss Skin: no rash MSK: No muscle spasm, no  deformity, no limitation of range of movement in spin Heme: No easy bruising.  Travel history: No recent long distant travel.  Allergy: No Known Allergies  Past Medical History  Diagnosis Date  . Asthma   . Allergy     Past Surgical History  Procedure Laterality Date  . Adenoidectomy      Social History:  reports that he has never smoked. He has never used smokeless tobacco. He reports that he drinks about 4.8 oz of alcohol per week. He reports that he does not use illicit drugs.  Family History:  Family History  Problem Relation Age of Onset  . Asthma Brother      Prior to Admission medications   Medication Sig Start Date End Date Taking? Authorizing Provider  albuterol (PROVENTIL HFA;VENTOLIN HFA) 108 (90 BASE) MCG/ACT inhaler Inhale 2 puffs into the lungs every 6 (six) hours as needed. 02/01/14  Yes Ethelda Chick, MD  Fluticasone-Salmeterol (ADVAIR) 250-50 MCG/DOSE AEPB Inhale 1 puff into the lungs every 12 (twelve) hours.   Yes Historical Provider, MD  predniSONE (DELTASONE) 10 MG tablet Take 10 mg by mouth daily with breakfast. Taper. Take 6 tablets on Day 1, Take 5 tablets on Day 2, Take 4 tablets on Day 3, Take 3 tablets on Day 4, Take 2 tablets on Day 5 and Take 1 tablet on Day 6.   Yes Historical Provider, MD  azelastine (ASTELIN) 137 MCG/SPRAY nasal spray Use 1 or 2 sprays in each nostril twice daily Patient not taking: Reported on 11/10/2015 03/19/14   Peyton Najjar, MD  fluticasone [facility]) 50 MCG/ACT nasal spray Place 2 sprays into  both nostrils daily. Patient not taking: Reported on 11/10/2015 03/19/14   Peyton Najjar, MD  ipratropium (ATROVENT) 0.03 % nasal spray Place 2 sprays into both nostrils 2 (two) times daily. 02/01/14 02/01/15  Ethelda Chick, MD  montelukast (SINGULAIR) 10 MG tablet Take 1 tablet (10 mg total) by mouth at bedtime. Patient not taking: Reported on 11/10/2015 03/19/14   Peyton Najjar, MD  olopatadine (PATANOL) 0.1 % ophthalmic solution Place 1  drop into both eyes 2 (two) times daily. Patient not taking: Reported on 11/10/2015 03/19/14   Peyton Najjar, MD  permethrin (ELIMITE) 5 % cream Apply topically once. Patient not taking: Reported on 11/10/2015 10/14/12   Carmelina Dane, MD    Physical Exam: Filed Vitals:   11/10/15 1908 11/10/15 2123 11/10/15 2246 11/11/15 0009  BP:  147/81 134/61 138/90  Pulse: 75 74 89 78  Temp:      TempSrc:      Resp: SpO2: 95% 98% 100% 94%   General: Not in acute distress HEENT:       Eyes: PERRL, EOMI, no scleral icterus.       ENT: No discharge from the ears and nose, no pharynx injection, no tonsillar enlargement.        Neck: No JVD, no bruit, no mass felt. Heme: No neck lymph node enlargement. Cardiac: S1/S2, RRR, No murmurs, No gallops or rubs. Pulm: decreased air movement and wheezing bilaterally. No rales or rubs. Abd: Soft, nondistended, nontender, no rebound pain, no organomegaly, BS present. Ext: No pitting leg edema bilaterally. 2+DP/PT pulse bilaterally. Musculoskeletal: No joint deformities, No joint redness or warmth, no limitation of ROM in spin. Skin: No rashes.  Neuro: Alert, oriented X3, cranial nerves II-XII grossly intact, muscle strength 5/5 in all extremities, sensation to light touch intact.  Psych: Patient is not psychotic, no suicidal or hemocidal ideation.  Labs on Admission:  Basic Metabolic Panel:  Recent Labs Lab 11/10/15 2141  NA 140  K 3.9  CL 102  GLUCOSE 105*  BUN 31*  CREATININE 1.20   Liver Function Tests: No results for input(s): AST, ALT, ALKPHOS, BILITOT, PROT, ALBUMIN in the last 168 hours. No results for input(s): LIPASE, AMYLASE in the last 168 hours. No results for input(s): AMMONIA in the last 168 hours. CBC:  Recent Labs Lab 11/10/15 2135 11/10/15 2141  WBC 18.4*  --   NEUTROABS 13.6*  --   HGB 14.5 16.7  HCT 43.4 49.0  MCV 86.5  --   PLT 237  --    Cardiac Enzymes: No results for input(s): CKTOTAL, CKMB,  CKMBINDEX, TROPONINI in the last 168 hours.  BNP (last 3 results) No results for input(s): BNP in the last 8760 hours.  ProBNP (last 3 results) No results for input(s): PROBNP in the last 8760 hours.  CBG: No results for input(s): GLUCAP in the last 168 hours.  Radiological Exams on Admission: Dg Chest 2 View  11/10/2015  CLINICAL DATA:  Cough.  Right chest pain.  Wheezing. EXAM: CHEST  2 VIEW COMPARISON:  12/21/2011 chest radiograph. FINDINGS: There is new extensive pneumomediastinum throughout the mediastinum. There is subcutaneous emphysema in the medial bilateral lower neck. Normal heart size. No pneumothorax. No pleural effusion. Clear lungs, with no focal lung consolidation and no pulmonary edema. IMPRESSION: New extensive pneumomediastinum and subcutaneous emphysema in the medial bilateral lower neck. Esophageal perforation cannot be excluded. Recommend further evaluation with chest CT with IV and water soluble oral  contrast and/or esophagram. These results were called by telephone at the time of interpretation on 11/10/2015 at 9:08 pm to PA [facility] , who verbally acknowledged these results. Electronically Signed   By: Delbert PhenixJason A Poff M.D.   On: 11/10/2015 21:06   Ct Chest W Contrast  11/10/2015  CLINICAL DATA:  Asthma flare up.  Pneumomediastinum on radiography. EXAM: CT CHEST WITH CONTRAST TECHNIQUE: Multidetector CT imaging of the chest was performed during intravenous contrast administration. CONTRAST:  80mL OMNIPAQUE IOHEXOL 300 MG/ML  SOLN COMPARISON:  11/10/2015 radiographs FINDINGS: There is extensive pneumomediastinum. There is distension of the esophagus with contrast and air, with no contrast leakage from the esophagus. There is no pneumothorax. There is no effusion. The lungs are clear. Central airways are patent. No mediastinal or hilar adenopathy. No significant skeletal lesion. IMPRESSION: Pneumomediastinum.  Esophagus appears intact. Electronically Signed   By: Ellery Plunkaniel R Mitchell  M.D.   On: 11/10/2015 22:29    EKG:  Not done in ED, will get one.   Assessment/Plan Principal Problem:   Asthma exacerbation Active Problems:   Allergic rhinitis   Pneumomediastinum (HCC)   Asthma exacerbation: Patient's wheezing, cough and shortness of breath are consistent with asthma exacerbation. His chest is most likely due to pneumomediastinum. He has very low risk for pulmonary embolism. No infiltration on chest x-ray.  -will admit patient to SDU given extensive pneumomediastinum -Nebulizers: scheduled Duoneb and prn albuterol -Solu-Medrol 60 mg IV bid -Dulera inhaler -IV azithromycin for 5 days.  -I will not give cough meds by mouth, hopefully, IV morphine will help his cough -Urine drug screen, HIV -Blood culture x2 if pt develops fever -Sputum culture, Flu pcr -NPO -ivf  Pneumomediastinum (HCC): EDP spoke with Dr Cornelius Moraswen, cardiothoracic surgery. Pt is suspected to have spontaneous primary pneumomediastinum from coughing.He recommended to keep pt from coughing. He may be kept NPO overnight with further study with esophagram in the morning if concern for esophageal rupture though less likely  -repeat CXR in AM -get Grafin swallow test in AM -prn morphine for pain  DVT ppx: SCD Code Status: Full code Family Communication: None at bed side.       Disposition Plan: Admit to inpatient   Date of Service 11/11/2015    Lorretta HarpIU, Jazen Spraggins [facility] Pager [phone]  If 7PM-7AM, please contact night-coverage [email or web address] Password [email or web address] 11/11/2015, 12:15 AM

## 2015-11-10 NOTE — ED Notes (Signed)
Patient transported to CT 

## 2015-11-11 ENCOUNTER — Inpatient Hospital Stay (HOSPITAL_COMMUNITY): Payer: BLUE CROSS/BLUE SHIELD

## 2015-11-11 ENCOUNTER — Encounter (HOSPITAL_COMMUNITY): Payer: Self-pay | Admitting: Internal Medicine

## 2015-11-11 DIAGNOSIS — J309 Allergic rhinitis, unspecified: Secondary | ICD-10-CM

## 2015-11-11 DIAGNOSIS — F1910 Other psychoactive substance abuse, uncomplicated: Secondary | ICD-10-CM

## 2015-11-11 DIAGNOSIS — J982 Interstitial emphysema: Secondary | ICD-10-CM

## 2015-11-11 DIAGNOSIS — N289 Disorder of kidney and ureter, unspecified: Secondary | ICD-10-CM

## 2015-11-11 DIAGNOSIS — J45901 Unspecified asthma with (acute) exacerbation: Principal | ICD-10-CM

## 2015-11-11 DIAGNOSIS — R9431 Abnormal electrocardiogram [ECG] [EKG]: Secondary | ICD-10-CM

## 2015-11-11 LAB — INFLUENZA PANEL BY PCR (TYPE A & B)
H1N1 flu by pcr: NOT DETECTED
INFLBPCR: NEGATIVE
Influenza A By PCR: NEGATIVE

## 2015-11-11 LAB — COMPREHENSIVE METABOLIC PANEL
ALT: 19 U/L (ref 17–63)
ANION GAP: 7 (ref 5–15)
AST: 21 U/L (ref 15–41)
Albumin: 4.2 g/dL (ref 3.5–5.0)
Alkaline Phosphatase: 40 U/L (ref 38–126)
BILIRUBIN TOTAL: 0.9 mg/dL (ref 0.3–1.2)
BUN: 19 mg/dL (ref 6–20)
CO2: 27 mmol/L (ref 22–32)
Calcium: 8.6 mg/dL — ABNORMAL LOW (ref 8.9–10.3)
Chloride: 104 mmol/L (ref 101–111)
Creatinine, Ser: 1.25 mg/dL — ABNORMAL HIGH (ref 0.61–1.24)
GFR calc Af Amer: >60 mL/min (ref >60)
Glucose, Bld: 124 mg/dL — ABNORMAL HIGH (ref 65–99)
POTASSIUM: 4.3 mmol/L (ref 3.5–5.1)
Sodium: 138 mmol/L (ref 135–145)
TOTAL PROTEIN: 7.3 g/dL (ref 6.5–8.1)

## 2015-11-11 LAB — CBC
HEMATOCRIT: 41.2 % (ref 39.0–52.0)
HEMOGLOBIN: 13.5 g/dL (ref 13.0–17.0)
MCH: 28.5 pg (ref 26.0–34.0)
MCHC: 32.8 g/dL (ref 30.0–36.0)
MCV: 86.9 fL (ref 78.0–100.0)
Platelets: 232 10*3/uL (ref 150–400)
RBC: 4.74 MIL/uL (ref 4.22–5.81)
RDW: 14.7 % (ref 11.5–15.5)
WBC: 12.6 10*3/uL — AB (ref 4.0–10.5)

## 2015-11-11 LAB — PROTIME-INR
INR: 1.11 (ref 0.00–1.49)
PROTHROMBIN TIME: 14.5 s (ref 11.6–15.2)

## 2015-11-11 LAB — TROPONIN I: Troponin I: <0.03 ng/mL (ref <0.031)

## 2015-11-11 LAB — GLUCOSE, CAPILLARY: GLUCOSE-CAPILLARY: 112 mg/dL — AB (ref 65–99)

## 2015-11-11 LAB — RAPID URINE DRUG SCREEN, HOSP PERFORMED
Amphetamines: NOT DETECTED
BARBITURATES: NOT DETECTED
BENZODIAZEPINES: NOT DETECTED
COCAINE: NOT DETECTED
OPIATES: POSITIVE — AB
Tetrahydrocannabinol: POSITIVE — AB

## 2015-11-11 LAB — EXPECTORATED SPUTUM ASSESSMENT W REFEX TO RESP CULTURE

## 2015-11-11 LAB — MRSA PCR SCREENING: MRSA by PCR: NEGATIVE

## 2015-11-11 LAB — APTT: aPTT: 29 seconds (ref 24–37)

## 2015-11-11 LAB — HIV ANTIBODY (ROUTINE TESTING W REFLEX): HIV Screen 4th Generation wRfx: NONREACTIVE

## 2015-11-11 LAB — EXPECTORATED SPUTUM ASSESSMENT W GRAM STAIN, RFLX TO RESP C

## 2015-11-11 MED ORDER — HYDROCODONE-HOMATROPINE 5-1.5 MG/5ML PO SYRP
5.0000 mL | ORAL_SOLUTION | ORAL | Status: DC | PRN
  Administered 2015-11-11 (×2): 5 mL via ORAL
  Filled 2015-11-11 (×2): qty 5

## 2015-11-11 MED ORDER — CETYLPYRIDINIUM CHLORIDE 0.05 % MT LIQD
7.0000 mL | Freq: Two times a day (BID) | OROMUCOSAL | Status: DC
  Administered 2015-11-11: 7 mL via OROMUCOSAL

## 2015-11-11 MED ORDER — BENZONATATE 100 MG PO CAPS
200.0000 mg | ORAL_CAPSULE | Freq: Three times a day (TID) | ORAL | Status: DC
  Administered 2015-11-11 (×2): 200 mg via ORAL
  Filled 2015-11-11 (×2): qty 2

## 2015-11-11 MED ORDER — INFLUENZA VAC SPLIT QUAD 0.5 ML IM SUSY
0.5000 mL | PREFILLED_SYRINGE | INTRAMUSCULAR | Status: AC
  Administered 2015-11-12: 0.5 mL via INTRAMUSCULAR
  Filled 2015-11-11 (×2): qty 0.5

## 2015-11-11 MED ORDER — PNEUMOCOCCAL VAC POLYVALENT 25 MCG/0.5ML IJ INJ
0.5000 mL | INJECTION | INTRAMUSCULAR | Status: AC
  Administered 2015-11-12: 0.5 mL via INTRAMUSCULAR
  Filled 2015-11-11 (×2): qty 0.5

## 2015-11-11 MED ORDER — LIP MEDEX EX OINT
TOPICAL_OINTMENT | CUTANEOUS | Status: AC
  Administered 2015-11-11: 01:00:00
  Filled 2015-11-11: qty 7

## 2015-11-11 MED ORDER — CHLORHEXIDINE GLUCONATE 0.12 % MT SOLN
15.0000 mL | Freq: Two times a day (BID) | OROMUCOSAL | Status: DC
  Administered 2015-11-11 (×2): 15 mL via OROMUCOSAL
  Filled 2015-11-11: qty 15

## 2015-11-11 MED ORDER — ALBUTEROL SULFATE (2.5 MG/3ML) 0.083% IN NEBU
2.5000 mg | INHALATION_SOLUTION | RESPIRATORY_TRACT | Status: DC | PRN
  Administered 2015-11-12: 2.5 mg via RESPIRATORY_TRACT
  Filled 2015-11-11: qty 3

## 2015-11-11 MED ORDER — IOHEXOL 300 MG/ML  SOLN
50.0000 mL | Freq: Once | INTRAMUSCULAR | Status: AC | PRN
  Administered 2015-11-11: 90 mL via ORAL

## 2015-11-11 NOTE — Progress Notes (Signed)
Pt bedside monitor and EKG from 0653 showing ST elevation in multiple leads. Pt is not having any active chest pain.  Informed Dr. Catha GosselinMikhail.  Cardiac Enzymes and cardiology consult ordered. Erick Blinksuchman, Mirabel Ahlgren D, RN

## 2015-11-11 NOTE — Consult Note (Signed)
Cardiology Consult Note  Admit date: 11/10/2015 Name: Jacob Harvey 24 y.o.  male DOB:  27-Jun-1991 MRN:  [id number]  Today's date:  11/11/2015  Referring Physician:    [facility]  Reason for Consultation:   Abnormal EKG   IMPRESSIONS: 1.  Pneumomediastinum 2.  EKG shows early repolarization which is a normal variant for a healthy black male 3.  Normal cardiac status  RECOMMENDATION: No additional cardiovascular workup is necessary.  His EKG shows early repolarization.  I believe that he has a healthy heart and just needs treatment for his pneumomediastinum and his asthma.  HISTORY: This 24 year old black male is seen for evaluation of an abnormal EKG.  He has a long-standing history of asthma but play college football and still works and is able to play basketball with no cardiovascular symptoms.  He presented with worsening shortness of breath since Thanksgiving and a dry cough.  He was treated with prednisone and continues to use his home inhaler but presented with pleuritic chest pain over his front chest and right neck base.  He presented to the emergency room where he was found to have an extensive pneumomediastinum.  His EKG is consistent with early repolarization and cardiology was asked to see him because of an abnormal EKG.  Echocardiogram was done and showed very mild right ventricular enlargement was otherwise normal.  I would interpret the echo was normal.  Past Medical History  Diagnosis Date  . Asthma   . Allergy       Past Surgical History  Procedure Laterality Date  . Adenoidectomy       Allergies:  has No Known Allergies.   Medications: Prior to Admission medications   Medication Sig Start Date End Date Taking? Authorizing Provider  albuterol (PROVENTIL HFA;VENTOLIN HFA) 108 (90 BASE) MCG/ACT inhaler Inhale 2 puffs into the lungs every 6 (six) hours as needed. 02/01/14  Yes Ethelda Chick, MD  Fluticasone-Salmeterol (ADVAIR) 250-50 MCG/DOSE AEPB Inhale  1 puff into the lungs every 12 (twelve) hours.   Yes Historical Provider, MD  predniSONE (DELTASONE) 10 MG tablet Take 10 mg by mouth daily with breakfast. Taper. Take 6 tablets on Day 1, Take 5 tablets on Day 2, Take 4 tablets on Day 3, Take 3 tablets on Day 4, Take 2 tablets on Day 5 and Take 1 tablet on Day 6.   Yes Historical Provider, MD  azelastine (ASTELIN) 137 MCG/SPRAY nasal spray Use 1 or 2 sprays in each nostril twice daily Patient not taking: Reported on 11/10/2015 03/19/14   Peyton Najjar, MD  fluticasone [facility]) 50 MCG/ACT nasal spray Place 2 sprays into both nostrils daily. Patient not taking: Reported on 11/10/2015 03/19/14   Peyton Najjar, MD  ipratropium (ATROVENT) 0.03 % nasal spray Place 2 sprays into both nostrils 2 (two) times daily. 02/01/14 02/01/15  Ethelda Chick, MD  montelukast (SINGULAIR) 10 MG tablet Take 1 tablet (10 mg total) by mouth at bedtime. Patient not taking: Reported on 11/10/2015 03/19/14   Peyton Najjar, MD  olopatadine (PATANOL) 0.1 % ophthalmic solution Place 1 drop into both eyes 2 (two) times daily. Patient not taking: Reported on 11/10/2015 03/19/14   Peyton Najjar, MD  permethrin (ELIMITE) 5 % cream Apply topically once. Patient not taking: Reported on 11/10/2015 10/14/12   Carmelina Dane, MD    Family History: Family Status  Relation Status Death Age  . Mother Alive   . Father Alive   . Brother Alive   .  Brother Alive     Social History:   reports that he has never smoked. He has never used smokeless tobacco. He reports that he drinks about 4.8 oz of alcohol per week. He reports that he does not use illicit drugs.   Social History   Social History Narrative   Marital status: single      Employment: works at a club      Education:  [employer] [place] [employer] senior; business major; Insurance account managermanagement.    Review of Systems: Otherwise normal except as noted above  Physical Exam: BP 158/59 mmHg  Pulse 56  Temp(Src) 97.4 F (36.3 C) (Oral)  Resp 10  Ht 6\' 3"   (1.905 m)  SpO2 95%  General appearance: Pleasant healthy-appearing black male in no acute distress Head: Normocephalic, without obvious abnormality, atraumatic Lungs: Rhonchi and wheezing bilaterally,  Heart: regular rate and rhythm, S1, S2 normal, no murmur, click, rub or gallop Extremities: extremities normal, atraumatic, no cyanosis or edema Neurologic: Grossly normal  Labs: CBC  Recent Labs  11/10/15 2135  11/11/15 0343  WBC 18.4*  --  12.6*  RBC 5.02  --  4.74  HGB 14.5  < > 13.5  HCT 43.4  < > 41.2  PLT 237  --  232  MCV 86.5  --  86.9  MCH 28.9  --  28.5  MCHC 33.4  --  32.8  RDW 14.6  --  14.7  LYMPHSABS 3.1  --   --   MONOABS 1.7*  --   --   EOSABS 0.0  --   --   BASOSABS 0.0  --   --   < > = values in this interval not displayed. CMP   Recent Labs  11/11/15 0343  NA 138  K 4.3  CL 104  CO2 27  GLUCOSE 124*  BUN 19  CREATININE 1.25*  CALCIUM 8.6*  PROT 7.3  ALBUMIN 4.2  AST 21  ALT 19  ALKPHOS 40  BILITOT 0.9  GFRNONAA >60  GFRAA >60    Recent Labs  11/11/15 0835  TROPONINI <0.03     Radiology: Pneumomediastinum and subcutaneous emphysema  EKG: Sinus rhythm with early repolarization  Signed:  W. Ashley RoyaltySpencer Doyce Saling, Jr. MD [facility]ACC   Cardiology Consultant  11/11/2015, 3:22 PM

## 2015-11-11 NOTE — Progress Notes (Addendum)
[facility]                                                                              Patient Demographics  Jacob Harvey, is a 24 y.o. male, DOB - 07-08-1991, ZOX:[id number]  Admit date - 11/10/2015   Admitting Physician Lorretta Harp, MD  Outpatient Primary MD for the patient is No PCP Per Patient  LOS - 1   Chief Complaint  Patient presents with  . Asthma  . Sore Throat      HPI on 11/10/2015 by Dr. Lorretta Harp Jacob Harvey is a 23 y.o. male with PMH of asthma, allergy, who presents with cough, shortness of breath, wheezing in the chest pain.  Patient reports that he start having worsening shortness of breath and wheezing on 11/06/15. He has dry cough. No fever or chills. He was seen in urgent care on 11/30, and was given prescription of prednisone. He has been taking this medication and continues to use his home Advair inhaler, but no significant improvement. He also has chest pain over front chest and right neck base. The chest pain is pleuritic, is aggravated by coughing and deep breath. It is also aggravated by swallowing. No tenderness over calf areas. Patient is physically active. Patient does not have fever, chills, abdominal pain, diarrhea, symptoms of UTI or unilateral weakness.   In ED, patient was found to have WBC 18.4 (patient is on prednisone), temperature normal, no tachycardia, electrolytes and renal function okay, negative rapid strept test. Chest x-ray showed no infiltration, but showed extensive pneumomediastinum which was confirmed by CT chest.   Assessment & Plan   Acute pneumomediastinum -Seen on CT chest  -Cardiothoracic surgery, Dr. Cornelius Moras, consulted by the EDP-suspect spontaneous primary pneumomediastinum from coughing -Spoke with Dr. Cornelius Moras to review results of Esophogram with water soln, normal esophagus.  Continue to treat the underlying cause, which is cough and asthma exac -Pending repeat CXR  ST Elevations on EKG -Possibly related to  pneumomediastinum vs pericarditis vs early repol -Tropononin negative, continue to cycle -Echocardiogram pending -Cardiology consulted and appreciated -Currently patient denies chest pain  Asthma exacerbation -Per patient, currently improving, continues to have some wheezing -Chest x-ray noted no infiltration -Continue nebs, azithromycin, solumedrol -Influenza negative -strep pneumonia urine antigen negative -Continue antitussives  Polysubstance abuse -UDS positive for marijuana  Leukocytosis -Likely reactive to pneumomediastinum, chest x-ray unremarkable for infection, patient denies issues with urination -Continue to monitor CBC  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 Continue to monitor  Time Spent in minutes   30 minutes  Procedures  None  Consults   Cardiology Cardiothoracic surgery, via phone   DVT Prophylaxis  SCDs  Lab Results  Component Value Date   PLT 232 11/11/2015    Medications  Scheduled Meds: . albuterol  10 mg/hr Nebulization Once  . antiseptic oral rinse  7 mL Mouth Rinse q12n4p  . azithromycin  500 mg Intravenous Q24H  . chlorhexidine  15 mL Mouth Rinse BID  . [START ON 11/12/2015] Influenza vac split quadrivalent PF  0.5 mL Intramuscular Tomorrow-1000  . methylPREDNISolone (  SOLU-MEDROL) injection  60 mg Intravenous Q12H  . mometasone-formoterol  2 puff Inhalation BID  . [START ON 11/12/2015] pneumococcal 23 valent vaccine  0.5 mL Intramuscular Tomorrow-1000  . sodium chloride  3 mL Intravenous Q12H   Continuous Infusions: . sodium chloride 100 mL/hr at 11/11/15 1100   PRN Meds:.albuterol, HYDROcodone-homatropine, morphine injection, ondansetron (ZOFRAN) IV  Antibiotics    Anti-infectives    Start     Dose/Rate Route Frequency Ordered Stop   11/10/15 2330  azithromycin (ZITHROMAX) 500 mg in dextrose 5 % 250  mL IVPB     500 mg 250 mL/hr over 60 Minutes Intravenous Every 24 hours 11/10/15 2325          Subjective:   Jacob Harvey seen and examined today.  Patient states his breathing has improved.  Denies chest pain, abdominal pain, nausea or vomiting. He is trying not to cough.    Objective:   Filed Vitals:   11/11/15 0500 11/11/15 0637 11/11/15 0800 11/11/15 0852  BP:  131/57    Pulse:  62    Temp: 98 F (36.7 C)  97.9 F (36.6 C)   TempSrc: Oral  Oral   Resp:  15    Height: 6\' 3"  (1.905 m)     SpO2:  92%  94%    Wt Readings from Last 3 Encounters:  03/19/14 101.515 kg (223 lb 12.8 oz)  02/01/14 100.517 kg (221 lb 9.6 oz)  07/03/13 98.431 kg (217 lb)     Intake/Output Summary (Last 24 hours) at 11/11/15 1150 Last data filed at 11/11/15 1100  Gross per 24 hour  Intake   1350 ml  Output    800 ml  Net    550 ml    Exam  General: Well developed, well nourished, NAD, appears stated age  HEENT: NCAT, mucous membranes moist.   Cardiovascular: S1 S2 auscultated, no rubs, murmurs or gallops. Regular rate and rhythm.  Respiratory: Diffuse Expiratory wheezing  Abdomen: Soft, nontender, nondistended, + bowel sounds  Extremities: warm dry without cyanosis clubbing or edema  Neuro: AAOx3, nonfocal  Psych: Normal affect and demeanor   Data Review   Micro Results Recent Results (from the past 240 hour(s))  Rapid strep screen (not at [facility])     Status: None   Collection Time: 11/10/15  6:15 PM  Result Value Ref Range Status   Streptococcus, Group A Screen (Direct) NEGATIVE NEGATIVE Final    Comment: (NOTE) A Rapid Antigen test may result negative if the antigen level in the sample is below the detection level of this test. The FDA has not cleared this test as a stand-alone test therefore the rapid antigen negative result has reflexed to a Group A Strep culture.   MRSA PCR Screening     Status: None   Collection Time: 11/11/15  1:00 AM  Result Value Ref Range  Status   MRSA by PCR NEGATIVE NEGATIVE Final    Comment:        The GeneXpert MRSA Assay (FDA approved for NASAL specimens only), is one component of a comprehensive MRSA colonization surveillance program. It is not intended to diagnose MRSA infection nor to guide or monitor treatment for MRSA infections.   Culture, expectorated sputum-assessment     Status: None   Collection Time: 11/11/15  8:17 AM  Result Value Ref Range Status   Specimen Description SPUTUM  Final   Special Requests NONE  Final   Sputum evaluation   Final    THIS SPECIMEN  IS ACCEPTABLE. RESPIRATORY CULTURE REPORT TO FOLLOW.   Report Status 11/11/2015 FINAL  Final    Radiology Reports Dg Chest 2 View  11/10/2015  CLINICAL DATA:  Cough.  Right chest pain.  Wheezing. EXAM: CHEST  2 VIEW COMPARISON:  12/21/2011 chest radiograph. FINDINGS: There is new extensive pneumomediastinum throughout the mediastinum. There is subcutaneous emphysema in the medial bilateral lower neck. Normal heart size. No pneumothorax. No pleural effusion. Clear lungs, with no focal lung consolidation and no pulmonary edema. IMPRESSION: New extensive pneumomediastinum and subcutaneous emphysema in the medial bilateral lower neck. Esophageal perforation cannot be excluded. Recommend further evaluation with chest CT with IV and water soluble oral contrast and/or esophagram. These results were called by telephone at the time of interpretation on 11/10/2015 at 9:08 pm to PA [facility] , who verbally acknowledged these results. Electronically Signed   By: Delbert PhenixJason A Poff M.D.   On: 11/10/2015 21:06   Ct Chest W Contrast  11/10/2015  CLINICAL DATA:  Asthma flare up.  Pneumomediastinum on radiography. EXAM: CT CHEST WITH CONTRAST TECHNIQUE: Multidetector CT imaging of the chest was performed during intravenous contrast administration. CONTRAST:  80mL OMNIPAQUE IOHEXOL 300 MG/ML  SOLN COMPARISON:  11/10/2015 radiographs FINDINGS: There is extensive  pneumomediastinum. There is distension of the esophagus with contrast and air, with no contrast leakage from the esophagus. There is no pneumothorax. There is no effusion. The lungs are clear. Central airways are patent. No mediastinal or hilar adenopathy. No significant skeletal lesion. IMPRESSION: Pneumomediastinum.  Esophagus appears intact. Electronically Signed   By: Ellery Plunkaniel R Mitchell M.D.   On: 11/10/2015 22:29   Dg Esophagus W/water Sol Cm  11/11/2015  CLINICAL DATA:  Pneumomediastinum EXAM: ESOPHOGRAM/BARIUM SWALLOW TECHNIQUE: Single contrast examination was performed using water-soluble common. FLUOROSCOPY TIME:  1 minutes and 48 seconds. COMPARISON:  None. FINDINGS: The esophagus is normal in caliber. There is no focal stricture or filling defect. There is no extravasation to suggest rupture. IMPRESSION: No evidence of esophageal injury Electronically Signed   By: Jolaine ClickArthur  Hoss M.D.   On: 11/11/2015 10:24    CBC  Recent Labs Lab 11/10/15 2135 11/10/15 2141 11/11/15 0343  WBC 18.4*  --  12.6*  HGB 14.5 16.7 13.5  HCT 43.4 49.0 41.2  PLT 237  --  232  MCV 86.5  --  86.9  MCH 28.9  --  28.5  MCHC 33.4  --  32.8  RDW 14.6  --  14.7  LYMPHSABS 3.1  --   --   MONOABS 1.7*  --   --   EOSABS 0.0  --   --   BASOSABS 0.0  --   --     Chemistries   Recent Labs Lab 11/10/15 2141 11/11/15 0343  NA 140 138  K 3.9 4.3  CL 102 104  CO2  --  27  GLUCOSE 105* 124*  BUN 31* 19  CREATININE 1.20 1.25*  CALCIUM  --  8.6*  AST  --  21  ALT  --  19  ALKPHOS  --  40  BILITOT  --  0.9   ------------------------------------------------------------------------------------------------------------------ CrCl cannot be calculated (Unknown ideal weight.). ------------------------------------------------------------------------------------------------------------------ No results for input(s): HGBA1C in the last 72  hours. ------------------------------------------------------------------------------------------------------------------ No results for input(s): CHOL, HDL, LDLCALC, TRIG, CHOLHDL, LDLDIRECT in the last 72 hours. ------------------------------------------------------------------------------------------------------------------ No results for input(s): TSH, T4TOTAL, T3FREE, THYROIDAB in the last 72 hours.  Invalid input(s): FREET3 ------------------------------------------------------------------------------------------------------------------ No results for input(s): VITAMINB12, FOLATE, FERRITIN, TIBC, IRON, RETICCTPCT in the last  72 hours.  Coagulation profile  Recent Labs Lab 11/10/15 2351  INR 1.11    No results for input(s): DDIMER in the last 72 hours.  Cardiac Enzymes  Recent Labs Lab 11/11/15 0835  TROPONINI <0.03   ------------------------------------------------------------------------------------------------------------------ Invalid input(s): POCBNP    Jacob Harvey D.O. on 11/11/2015 at 11:50 AM  Between 7am to 7pm - Pager - [phone]  After 7pm go to [email or web address] - password [email or web address]  And look for the night coverage person covering for me after hours  [facility] Office  [phone]

## 2015-11-12 ENCOUNTER — Inpatient Hospital Stay (HOSPITAL_COMMUNITY): Payer: BLUE CROSS/BLUE SHIELD

## 2015-11-12 DIAGNOSIS — R9431 Abnormal electrocardiogram [ECG] [EKG]: Secondary | ICD-10-CM

## 2015-11-12 DIAGNOSIS — N289 Disorder of kidney and ureter, unspecified: Secondary | ICD-10-CM | POA: Insufficient documentation

## 2015-11-12 DIAGNOSIS — F1910 Other psychoactive substance abuse, uncomplicated: Secondary | ICD-10-CM | POA: Insufficient documentation

## 2015-11-12 LAB — CBC
HCT: 41.2 % (ref 39.0–52.0)
Hemoglobin: 13.7 g/dL (ref 13.0–17.0)
MCH: 28.5 pg (ref 26.0–34.0)
MCHC: 33.3 g/dL (ref 30.0–36.0)
MCV: 85.8 fL (ref 78.0–100.0)
PLATELETS: 246 10*3/uL (ref 150–400)
RBC: 4.8 MIL/uL (ref 4.22–5.81)
RDW: 14.5 % (ref 11.5–15.5)
WBC: 16.9 10*3/uL — AB (ref 4.0–10.5)

## 2015-11-12 LAB — GLUCOSE, CAPILLARY: GLUCOSE-CAPILLARY: 115 mg/dL — AB (ref 65–99)

## 2015-11-12 LAB — BASIC METABOLIC PANEL
Anion gap: 6 (ref 5–15)
BUN: 17 mg/dL (ref 6–20)
CALCIUM: 8.7 mg/dL — AB (ref 8.9–10.3)
CHLORIDE: 103 mmol/L (ref 101–111)
CO2: 27 mmol/L (ref 22–32)
CREATININE: 1.17 mg/dL (ref 0.61–1.24)
GFR calc Af Amer: >60 mL/min (ref >60)
GFR calc non Af Amer: >60 mL/min (ref >60)
Glucose, Bld: 117 mg/dL — ABNORMAL HIGH (ref 65–99)
Potassium: 4.3 mmol/L (ref 3.5–5.1)
SODIUM: 136 mmol/L (ref 135–145)

## 2015-11-12 LAB — CULTURE, GROUP A STREP: Strep A Culture: NEGATIVE

## 2015-11-12 MED ORDER — FLUTICASONE-SALMETEROL 250-50 MCG/DOSE IN AEPB: 1.0000 | INHALATION_SPRAY | Freq: Two times a day (BID) | RESPIRATORY_TRACT | Status: AC

## 2015-11-12 MED ORDER — PREDNISONE 10 MG PO TABS: ORAL_TABLET | ORAL | Status: AC

## 2015-11-12 MED ORDER — ALBUTEROL SULFATE HFA 108 (90 BASE) MCG/ACT IN AERS: 2.0000 | INHALATION_SPRAY | Freq: Four times a day (QID) | RESPIRATORY_TRACT | Status: AC | PRN

## 2015-11-12 MED ORDER — BENZONATATE 200 MG PO CAPS: 200.0000 mg | ORAL_CAPSULE | Freq: Three times a day (TID) | ORAL | Status: AC

## 2015-11-12 MED ORDER — AZITHROMYCIN 250 MG PO TABS: 250.0000 mg | ORAL_TABLET | Freq: Every day | ORAL | Status: AC

## 2015-11-12 MED ORDER — HYDROCODONE-HOMATROPINE 5-1.5 MG/5ML PO SYRP: 5.0000 mL | ORAL_SOLUTION | ORAL | Status: AC | PRN

## 2015-11-12 NOTE — Discharge Summary (Signed)
Physician Discharge Summary  Jacob Harvey ZOX:[id number] DOB: 06/11/1991 DOA: 11/10/2015  PCP: No PCP Per Patient  Admit date: 11/10/2015 Discharge date: 11/12/2015  Time spent: 45 minutes  Recommendations for Outpatient Follow-up:  Patient will be discharged to home.  Patient will need to follow up with primary care provider within one week of discharge, repeat Chest Xray, repeat CBC and BMP.  Patient should continue medications as prescribed.  Patient should follow a regular diet.   Discharge Diagnoses:  Pneumomediastinum Abnormal EKG Asthma exacerbation Polysubstance abuse Leukocytosis Renal insufficiency versus CKD, stage II  Discharge Condition: Stable  Diet recommendation: Regular  There were no vitals filed for this visit.  History of present illness:  on 11/10/2015 by Dr. Lorretta Harp Pascal Stiggers is a 24 y.o. male with PMH of asthma, allergy, who presents with cough, shortness of breath, wheezing in the chest pain.  Patient reports that he start having worsening shortness of breath and wheezing on 11/06/15. He has dry cough. No fever or chills. He was seen in urgent care on 11/30, and was given prescription of prednisone. He has been taking this medication and continues to use his home Advair inhaler, but no significant improvement. He also has chest pain over front chest and right neck base. The chest pain is pleuritic, is aggravated by coughing and deep breath. It is also aggravated by swallowing. No tenderness over calf areas. Patient is physically active. Patient does not have fever, chills, abdominal pain, diarrhea, symptoms of UTI or unilateral weakness.   In ED, patient was found to have WBC 18.4 (patient is on prednisone), temperature normal, no tachycardia, electrolytes and renal function okay, negative rapid strept test. Chest x-ray showed no infiltration, but showed extensive pneumomediastinum which was confirmed by CT chest.   Hospital Course:  Acute  pneumomediastinum -Seen on CT chest  -Cardiothoracic surgery, Dr. Cornelius Moras, consulted by the EDP-suspect spontaneous primary pneumomediastinum from coughing -Spoke with Dr. Cornelius Moras to review results of Esophogram with water soln, normal esophagus. Continue to treat the underlying cause, which is cough and asthma exac -Repeat CXR shows mild improvement in pneumomediastinum, no pneumothorax  Abnormal EKG -ST Elevations on EKG -Possibly related to pneumomediastinum vs early repol -Tropononin cycled and negative -Echocardiogram EF 55-60% -Cardiology consulted and appreciated- no further cardiac workup needed. 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   Cardiovascular: S1 S2 auscultated, RRR, no murmurs  Respiratory: Diffuse Expiratory wheezing- improved.  Good air movement. Normal effort  Abdomen: Soft, nontender, nondistended, + bowel sounds  Extremities: warm dry without cyanosis clubbing or edema  Neuro: AAOx3, nonfocal  Psych: Normal affect and demeanor, pleasant  Discharge Instructions      Discharge Instructions     Discharge instructions    Complete by:  As directed   Patient will be discharged to home.  Patient will need to follow up with primary care provider within one week of discharge, repeat Chest Xray, repeat CBC and BMP.  Patient should continue medications as prescribed.  Patient should follow a regular diet.            Medication List    STOP taking these medications        azelastine 0.1 % nasal spray  Commonly known as:  ASTELIN     fluticasone 50 MCG/ACT nasal spray  Commonly known as:  FLONASE     ipratropium 0.03 % nasal spray  Commonly known as:  ATROVENT     montelukast 10 MG tablet  Commonly known as:  SINGULAIR     olopatadine 0.1 % ophthalmic solution  Commonly known as:  PATANOL     permethrin 5 % cream  Commonly known as:  ELIMITE      TAKE these medications        albuterol 108 (90 BASE) MCG/ACT inhaler  Commonly known as:  PROVENTIL HFA;VENTOLIN HFA  Inhale 2 puffs into the lungs every 6 (six) hours as needed.     albuterol 108 (90 BASE) MCG/ACT inhaler  Commonly known as:  PROVENTIL HFA;VENTOLIN HFA  Inhale 2 puffs into the lungs every 6 (six) hours as needed for wheezing or shortness of breath.     azithromycin 250 MG tablet  Commonly known as:  ZITHROMAX  Take 1 tablet (250 mg total) by mouth daily.     benzonatate 200 MG capsule  Commonly known as:  TESSALON  Take 1 capsule (200 mg total) by mouth 3 (three) times daily.     Fluticasone-Salmeterol 250-50 MCG/DOSE Aepb  Commonly known as:  ADVAIR  Inhale 1 puff into the lungs every 12 (twelve) hours.     HYDROcodone-homatropine 5-1.5 MG/5ML syrup  Commonly known as:  HYCODAN  Take 5 mLs by mouth every 4 (four) hours as needed for cough.     predniSONE 10 MG tablet  Commonly known as:  DELTASONE  Take  (4 tabs) x 3 days, then taper to  (3 tabs) x 3 days, then  (2 tabs) x 3days, then  (1 tab) x 3days       No Known Allergies Follow-up Information    Follow up with  Primary care physician. Schedule an appointment as soon as possible for a visit in 1 week.   Why:  Repeat Chest Xray and CBC and BMP. Hospital follow up for pneumomediastinum       The results of significant diagnostics from this hospitalization (including imaging, microbiology, ancillary and laboratory) are listed below for reference.    Significant Diagnostic Studies: Dg Chest 2 View  11/12/2015  CLINICAL DATA:  Chest pain, wheezing, shortness of breath, cough for 3 days. Recent diagnosis of pneumomediastinum. EXAM: CHEST  2 VIEW COMPARISON:  Chest x-ray dated 11/11/2015 and chest CT dated 11/10/2015. FINDINGS: Heart size is normal. Overall cardiomediastinal silhouette is stable in size and configuration. The mediastinal air tracking into the inferior neck appears stable, or perhaps slightly decreased, compared to the previous chest x-ray of 11/11/2015. Lungs remain clear. No evidence of pneumonia. No pleural effusion seen. No pneumothorax seen. Osseous structures about the chest are unremarkable. IMPRESSION: No new findings. Previously diagnosed pneumomediastinum. The mediastinal air tracking into the inferior neck appears stable, or perhaps slightly decreased, compared to yesterday's chest x-ray. Electronically Signed   By: Bary Richard M.D.   On:  11/12/2015 08:34   Dg Chest 2 View  11/10/2015  CLINICAL DATA:  Cough.  Right chest pain.  Wheezing. EXAM: CHEST  2 VIEW COMPARISON:  12/21/2011 chest radiograph. FINDINGS: There is new extensive pneumomediastinum throughout the mediastinum. There is subcutaneous emphysema in the medial bilateral lower neck. Normal heart size. No pneumothorax. No pleural effusion. Clear lungs, with no focal lung consolidation and no pulmonary edema. IMPRESSION: New extensive pneumomediastinum and subcutaneous emphysema in the medial bilateral lower neck. Esophageal perforation cannot be excluded. Recommend further evaluation with chest CT with IV and water soluble oral  contrast and/or esophagram. These results were called by telephone at the time of interpretation on 11/10/2015 at 9:08 pm to PA [facility] , who verbally acknowledged these results. Electronically Signed   By: Delbert PhenixJason A Poff M.D.   On: 11/10/2015 21:06   Ct Chest W Contrast  11/10/2015  CLINICAL DATA:  Asthma flare up.  Pneumomediastinum on radiography. EXAM: CT CHEST WITH CONTRAST TECHNIQUE: Multidetector CT imaging of the chest was performed during intravenous contrast administration. CONTRAST:  80mL OMNIPAQUE IOHEXOL 300 MG/ML  SOLN COMPARISON:  11/10/2015 radiographs FINDINGS: There is extensive pneumomediastinum. There is distension of the esophagus with contrast and air, with no contrast leakage from the esophagus. There is no pneumothorax. There is no effusion. The lungs are clear. Central airways are patent. No mediastinal or hilar adenopathy. No significant skeletal lesion. IMPRESSION: Pneumomediastinum.  Esophagus appears intact. Electronically Signed   By: Ellery Plunkaniel R Mitchell M.D.   On: 11/10/2015 22:29   Dg Chest Port 1 View  11/11/2015  CLINICAL DATA:  Followup pneumomediastinum. No esophageal rupture identified on an esophagram today. EXAM: PORTABLE CHEST 1 VIEW COMPARISON:  Radiographs and chest CT obtained yesterday and esophagram obtained today. FINDINGS: No significant change in amount of mediastinal air tracking into the inferior neck. Normal sized heart. Clear lungs. No pneumothorax seen. Mild central peribronchial thickening. Unremarkable bones. IMPRESSION: 1. Stable pneumomediastinum. 2. No pneumothorax. 3. Mild bronchitic changes. Electronically Signed   By: Beckie SaltsSteven  Reid M.D.   On: 11/11/2015 18:50   Dg Esophagus W/water Sol Cm  11/11/2015  CLINICAL DATA:  Pneumomediastinum EXAM: ESOPHOGRAM/BARIUM SWALLOW TECHNIQUE: Single contrast examination was performed using water-soluble common. FLUOROSCOPY TIME:  1 minutes and 48 seconds. COMPARISON:  None. FINDINGS: The esophagus is normal in  caliber. There is no focal stricture or filling defect. There is no extravasation to suggest rupture. IMPRESSION: No evidence of esophageal injury Electronically Signed   By: Jolaine ClickArthur  Hoss M.D.   On: 11/11/2015 10:24    Microbiology: Recent Results (from the past 240 hour(s))  Rapid strep screen (not [facility])     Status: None   Collection Time: 11/10/15  6:15 PM  Result Value Ref Range Status   Streptococcus, Group A Screen (Direct) NEGATIVE NEGATIVE Final    Comment: (NOTE) A Rapid Antigen test may result negative if the antigen level in the sample is below the detection level of this test. The FDA has not cleared this test as a stand-alone test therefore the rapid antigen negative result has reflexed to a Group A Strep culture.   MRSA PCR Screening     Status: None   Collection Time: 11/11/15  1:00 AM  Result Value Ref Range Status   MRSA by PCR NEGATIVE NEGATIVE Final    Comment:        The GeneXpert MRSA Assay (FDA approved for NASAL specimens only), is one component of a comprehensive MRSA colonization surveillance program. It is not  intended to diagnose MRSA infection nor to guide or monitor treatment for MRSA infections.   Culture, expectorated sputum-assessment     Status: None   Collection Time: 11/11/15  8:17 AM  Result Value Ref Range Status   Specimen Description SPUTUM  Final   Special Requests NONE  Final   Sputum evaluation   Final    THIS SPECIMEN IS ACCEPTABLE. RESPIRATORY CULTURE REPORT TO FOLLOW.   Report Status 11/11/2015 FINAL  Final     Labs: Basic Metabolic Panel:  Recent Labs Lab 11/10/15 2141 11/11/15 0343 11/12/15 0357  NA 140 138 136  K 3.9 4.3 4.3  CL 102 104 103  CO2  --  27 27  GLUCOSE 105* 124* 117*  BUN 31* 19 17  CREATININE 1.20 1.25* 1.17  CALCIUM  --  8.6* 8.7*   Liver Function Tests:  Recent Labs Lab 11/11/15 0343  AST 21  ALT 19  ALKPHOS 40  BILITOT 0.9  PROT 7.3  ALBUMIN 4.2   No results for input(s): LIPASE,  AMYLASE in the last 168 hours. No results for input(s): AMMONIA in the last 168 hours. CBC:  Recent Labs Lab 11/10/15 2135 11/10/15 2141 11/11/15 0343 11/12/15 0357  WBC 18.4*  --  12.6* 16.9*  NEUTROABS 13.6*  --   --   --   HGB 14.5 16.7 13.5 13.7  HCT 43.4 49.0 41.2 41.2  MCV 86.5  --  86.9 85.8  PLT 237  --  232 246   Cardiac Enzymes:  Recent Labs Lab 11/11/15 0835 11/11/15 1415 11/11/15 2110  TROPONINI <0.03 <0.03 <0.03   BNP: BNP (last 3 results) No results for input(s): BNP in the last 8760 hours.  ProBNP (last 3 results) No results for input(s): PROBNP in the last 8760 hours.  CBG:  Recent Labs Lab 11/11/15 0752 11/12/15 0759  GLUCAP 112* 115*       Signed:  Edsel Petrin  [facility] 11/12/2015, 9:34 AM

## 2015-11-12 NOTE — Discharge Instructions (Signed)
Pneumomediastinum Pneumomediastinum occurs when air leaks into the mediastinum. The mediastinum is the area between the lungs, just behind the breastbone. This space contains the heart, aorta, venae cavae, esophagus, and thymus. CAUSES  Pneumomediastinum can occur on its own (spontaneous), or it can occur following an injury (traumatic). Pneumomediastinum is usually caused by a condition or injury that forces air to leak out of the lungs and into the mediastinum. Common conditions and injuries that lead to pneumomediastinum include:  Childbirth.  Chest or abdominal injuries, such as blunt or penetrating trauma.  Asthma.  Problems during scuba diving.  Problems during the use of a breathing machine (ventilator).  Inhaling illegal drugs or chemicals.  Infections in the face, neck, chest, or abdomen.  Extreme strain during coughing or vomiting.  A hole in the intestine or esophagus.  Ingesting a corrosive solvent.  Accidentally making a hole in the lung or intestine during surgery or a medical procedure.  Accidentally breathing an object into the airway. SYMPTOMS  In some cases, there may be no symptoms. A lack of symptoms is more likely with spontaneous pneumomediastinum. When symptoms are present, they may include:  Chest pain, which may run into the neck, shoulder, back, or arms.  Increased pain when moving, swallowing, or taking a deep breath.  Problems swallowing.  Problems speaking.  Vocal changes.  Shortness of breath.  Fever.  Throat or jaw pain. DIAGNOSIS  Your health care provider may suspect pneumomediastinum based on your symptoms and a physical exam. If needed, imaging tests may be done such as a chest X-ray or CT. TREATMENT  Spontaneous pneumomediastinum often gets better without any treatment. Your body will slowly reabsorb the air. In more severe cases, treatment is directed at addressing the cause. This may include antibiotic medicines to treat an  infection. In very severe cases, the air may build up and start to put pressure on the heart or lungs. In this case, you will need to stay in the hospital. One of the following procedures may be needed:  Needle aspiration. This procedure uses a needle that is inserted into the mediastinum to take out the trapped air.  Chest tube placement. This may be done if you have a collapsed lung.  Surgery to repair a hole in the intestine or esophagus. HOME CARE INSTRUCTIONS   Until your health care provider says it is okay, avoid:  Air travel.  Scuba diving.  High altitudes.  Hard physical work.  Exercise.  Do not use any tobacco products including cigarettes, chewing tobacco, or electronic cigarettes.  Do not use illegal drugs.  Only take medicine as directed by your health care provider.  If you have had surgery, a chest tube placed, or a needle aspiration, watch for drainage, redness, swelling, or pain at any incision or puncture sites. Contact your health care provider if you notice any of these symptoms. SEEK MEDICAL CARE IF: You have a fever. SEEK IMMEDIATE MEDICAL CARE IF:  You have worsening pain in the chest, neck, jaw, or arms.  You have trouble breathing.  You have new problems with speaking or swallowing. MAKE SURE YOU:  Understand these instructions.  Will watch your condition.  Will get help right away if you are not doing well or get worse.   This information is not intended to replace advice given to you by your health care provider. Make sure you discuss any questions you have with your health care provider.   Document Released: 11/07/2008 Document Revised: 11/30/2013 Document Reviewed: 02/17/2012  [employer] Interactive Patient Education ©2016 [employer] ° °

## 2015-11-14 LAB — CULTURE, RESPIRATORY: CULTURE: NORMAL

## 2015-11-14 LAB — CULTURE, RESPIRATORY W GRAM STAIN

## 2017-06-15 IMAGING — DX DG CHEST 1V PORT
1 series · 1 of 1 positions shown · non-contrast
Comparison: Radiographs and chest CT obtained yesterday and
esophagram obtained today.

CLINICAL DATA: Followup pneumomediastinum. No esophageal rupture
identified on an esophagram today.

EXAM:
PORTABLE CHEST 1 VIEW

[chest ap]
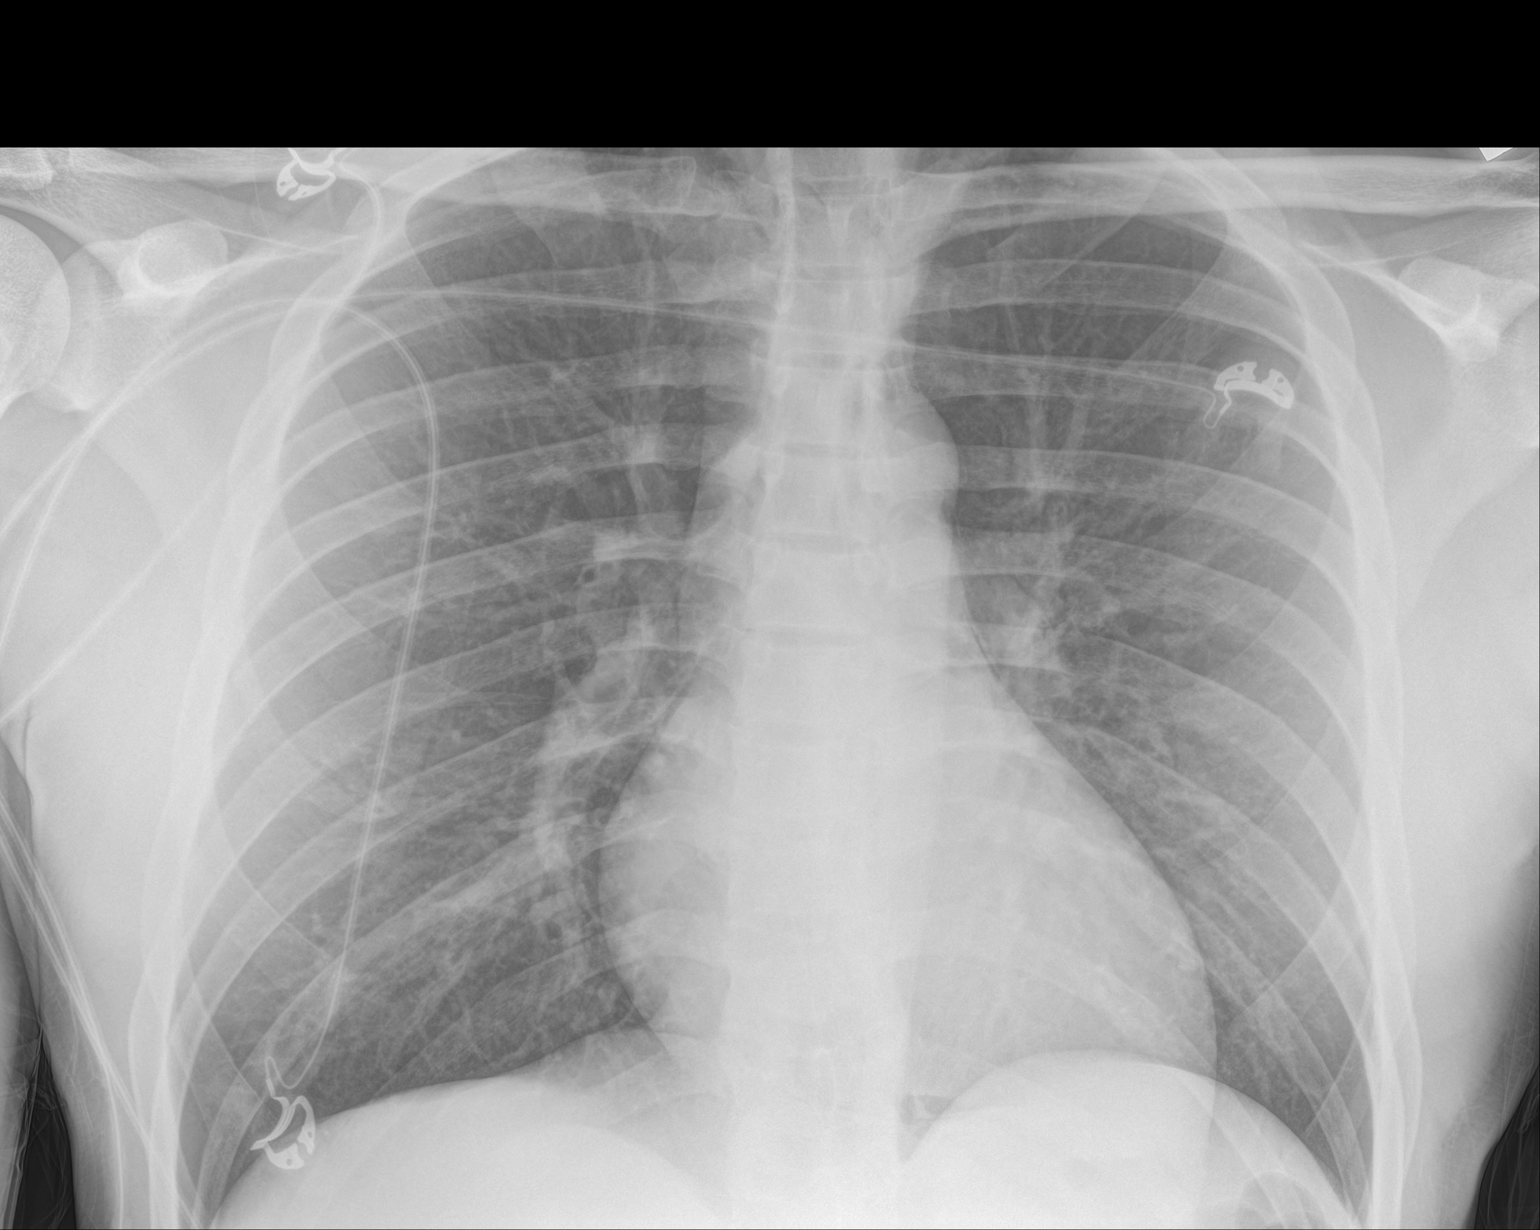

[1 of 1 positions shown; findings below may reference images not displayed]

FINDINGS: No significant change in amount of mediastinal air tracking into the
inferior neck. Normal sized heart. Clear lungs. No pneumothorax
seen. Mild central peribronchial thickening. Unremarkable bones.
IMPRESSION: 1. Stable pneumomediastinum.
2. No pneumothorax.
3. Mild bronchitic changes.
